# Patient Record
Sex: Female | Born: 1970 | Race: White | Hispanic: No | Smoking: Current every day smoker
Health system: Southern US, Community
[De-identification: ages and names within clinical notes are randomized; demographics above are authoritative.]

## PROBLEM LIST (undated history)

## (undated) DIAGNOSIS — R569 Unspecified convulsions: Secondary | ICD-10-CM

## (undated) DIAGNOSIS — C801 Malignant (primary) neoplasm, unspecified: Secondary | ICD-10-CM

## (undated) DIAGNOSIS — F319 Bipolar disorder, unspecified: Secondary | ICD-10-CM

## (undated) DIAGNOSIS — M1990 Unspecified osteoarthritis, unspecified site: Secondary | ICD-10-CM

## (undated) DIAGNOSIS — N390 Urinary tract infection, site not specified: Secondary | ICD-10-CM

## (undated) DIAGNOSIS — Q059 Spina bifida, unspecified: Secondary | ICD-10-CM

## (undated) DIAGNOSIS — F209 Schizophrenia, unspecified: Secondary | ICD-10-CM

## (undated) DIAGNOSIS — F29 Unspecified psychosis not due to a substance or known physiological condition: Secondary | ICD-10-CM

## (undated) DIAGNOSIS — G919 Hydrocephalus, unspecified: Secondary | ICD-10-CM

## (undated) DIAGNOSIS — A6000 Herpesviral infection of urogenital system, unspecified: Secondary | ICD-10-CM

## (undated) HISTORY — PX: SPINAL FUSION: SHX223

## (undated) HISTORY — PX: COLON RESECTION: SHX5231

## (undated) HISTORY — DX: Bipolar disorder, unspecified: F31.9

## (undated) HISTORY — PX: HIP SURGERY: SHX245

## (undated) HISTORY — PX: DENTAL SURGERY: SHX609

## (undated) HISTORY — PX: LEFT COLECTOMY: SHX856

## (undated) HISTORY — PX: CLUB FOOT RELEASE: SHX1363

## (undated) HISTORY — DX: Schizophrenia, unspecified: F20.9

---
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
## 2011-12-21 ENCOUNTER — Encounter (HOSPITAL_COMMUNITY): Payer: Self-pay | Admitting: *Deleted

## 2011-12-21 ENCOUNTER — Emergency Department (HOSPITAL_COMMUNITY): Payer: Medicare Other

## 2011-12-21 ENCOUNTER — Emergency Department (HOSPITAL_COMMUNITY)
Admission: EM | Admit: 2011-12-21 | Discharge: 2011-12-22 | Disposition: A | Discharge status: Home / Self Care | Payer: Medicare Other | Attending: Emergency Medicine | Admitting: Emergency Medicine

## 2011-12-21 DIAGNOSIS — R45851 Suicidal ideations: Secondary | ICD-10-CM | POA: Insufficient documentation

## 2011-12-21 DIAGNOSIS — Q059 Spina bifida, unspecified: Secondary | ICD-10-CM | POA: Insufficient documentation

## 2011-12-21 DIAGNOSIS — F329 Major depressive disorder, single episode, unspecified: Secondary | ICD-10-CM

## 2011-12-21 DIAGNOSIS — G40909 Epilepsy, unspecified, not intractable, without status epilepticus: Secondary | ICD-10-CM | POA: Insufficient documentation

## 2011-12-21 DIAGNOSIS — Z85038 Personal history of other malignant neoplasm of large intestine: Secondary | ICD-10-CM | POA: Insufficient documentation

## 2011-12-21 DIAGNOSIS — R079 Chest pain, unspecified: Secondary | ICD-10-CM | POA: Insufficient documentation

## 2011-12-21 DIAGNOSIS — Z79899 Other long term (current) drug therapy: Secondary | ICD-10-CM | POA: Insufficient documentation

## 2011-12-21 HISTORY — DX: Hydrocephalus, unspecified: G91.9

## 2011-12-21 HISTORY — DX: Malignant (primary) neoplasm, unspecified: C80.1

## 2011-12-21 HISTORY — DX: Spina bifida, unspecified: Q05.9

## 2011-12-21 HISTORY — DX: Herpesviral infection of urogenital system, unspecified: A60.00

## 2011-12-21 HISTORY — DX: Unspecified convulsions: R56.9

## 2011-12-21 HISTORY — DX: Unspecified osteoarthritis, unspecified site: M19.90

## 2011-12-21 HISTORY — DX: Urinary tract infection, site not specified: N39.0

## 2011-12-21 HISTORY — DX: Unspecified psychosis not due to a substance or known physiological condition: F29

## 2011-12-21 LAB — BASIC METABOLIC PANEL
CO2: 26 mEq/L (ref 19–32)
GFR calc non Af Amer: >90 mL/min (ref >90)
Glucose, Bld: 78 mg/dL (ref 70–99)
Potassium: 3.9 mEq/L (ref 3.5–5.1)
Sodium: 137 mEq/L (ref 135–145)

## 2011-12-21 LAB — URINALYSIS, ROUTINE W REFLEX MICROSCOPIC
Bilirubin Urine: NEGATIVE
Ketones, ur: NEGATIVE mg/dL
Leukocytes, UA: NEGATIVE
Nitrite: NEGATIVE
Specific Gravity, Urine: 1.005 (ref 1.005–1.030)
Urobilinogen, UA: 0.2 mg/dL (ref 0.0–1.0)
pH: 7.5 (ref 5.0–8.0)

## 2011-12-21 LAB — CBC
Hemoglobin: 11.6 g/dL — ABNORMAL LOW (ref 12.0–15.0)
RBC: 4.04 MIL/uL (ref 3.87–5.11)
WBC: 7.2 10*3/uL (ref 4.0–10.5)

## 2011-12-21 LAB — RAPID URINE DRUG SCREEN, HOSP PERFORMED
Amphetamines: NOT DETECTED
Barbiturates: POSITIVE — AB
Cocaine: NOT DETECTED
Opiates: NOT DETECTED
Tetrahydrocannabinol: NOT DETECTED

## 2011-12-21 LAB — PREGNANCY, URINE: Preg Test, Ur: NEGATIVE

## 2011-12-21 MED ORDER — CITALOPRAM HYDROBROMIDE 20 MG PO TABS
40.0000 mg | ORAL_TABLET | Freq: Every day | ORAL | Status: DC
  Administered 2011-12-21 – 2011-12-22 (×2): 40 mg via ORAL
  Filled 2011-12-21 (×2): qty 1
  Filled 2011-12-21: qty 2

## 2011-12-21 MED ORDER — LURASIDONE HCL 40 MG PO TABS
120.0000 mg | ORAL_TABLET | Freq: Every day | ORAL | Status: DC
  Administered 2011-12-21: 120 mg via ORAL
  Filled 2011-12-21: qty 3
  Filled 2011-12-21: qty 1
  Filled 2011-12-21: qty 3

## 2011-12-21 MED ORDER — CHLORPROMAZINE HCL 10 MG PO TABS
10.0000 mg | ORAL_TABLET | Freq: Three times a day (TID) | ORAL | Status: DC
  Administered 2011-12-21 – 2011-12-22 (×5): 10 mg via ORAL
  Filled 2011-12-21 (×7): qty 1

## 2011-12-21 MED ORDER — LORAZEPAM 1 MG PO TABS: 1.0000 mg | ORAL_TABLET | Freq: Three times a day (TID) | ORAL | Status: DC | PRN

## 2011-12-21 MED ORDER — PHENOBARBITAL 32.4 MG PO TABS
64.8000 mg | ORAL_TABLET | Freq: Every day | ORAL | Status: DC
  Administered 2011-12-21: 64.8 mg via ORAL
  Filled 2011-12-21: qty 2

## 2011-12-21 MED ORDER — LORATADINE 10 MG PO TABS
10.0000 mg | ORAL_TABLET | Freq: Every day | ORAL | Status: DC
  Administered 2011-12-21 – 2011-12-22 (×2): 10 mg via ORAL
  Filled 2011-12-21 (×2): qty 1

## 2011-12-21 MED ORDER — ADULT MULTIVITAMIN W/MINERALS CH
1.0000 | ORAL_TABLET | Freq: Every day | ORAL | Status: DC
  Administered 2011-12-22: 1 via ORAL
  Filled 2011-12-21 (×2): qty 1

## 2011-12-21 MED ORDER — DOCUSATE SODIUM 100 MG PO CAPS
100.0000 mg | ORAL_CAPSULE | Freq: Every day | ORAL | Status: DC
  Filled 2011-12-21 (×2): qty 1

## 2011-12-21 MED ORDER — PANTOPRAZOLE SODIUM 40 MG PO TBEC
40.0000 mg | DELAYED_RELEASE_TABLET | Freq: Every day | ORAL | Status: DC
  Administered 2011-12-21 – 2011-12-22 (×2): 40 mg via ORAL
  Filled 2011-12-21 (×2): qty 1

## 2011-12-21 MED ORDER — ONDANSETRON HCL 4 MG PO TABS: 4.0000 mg | ORAL_TABLET | Freq: Three times a day (TID) | ORAL | Status: DC | PRN

## 2011-12-21 MED ORDER — ACETAMINOPHEN 325 MG PO TABS: 650.0000 mg | ORAL_TABLET | ORAL | Status: DC | PRN

## 2011-12-21 MED ORDER — PANTOPRAZOLE SODIUM 20 MG PO TBEC: 20.0000 mg | DELAYED_RELEASE_TABLET | Freq: Every day | ORAL | Status: DC

## 2011-12-21 MED ORDER — FAMOTIDINE 20 MG PO TABS
20.0000 mg | ORAL_TABLET | Freq: Every day | ORAL | Status: DC
  Administered 2011-12-21 – 2011-12-22 (×2): 20 mg via ORAL
  Filled 2011-12-21 (×2): qty 1

## 2011-12-21 NOTE — BHH Counselor (Signed)
Spoke with Lauralee [facility] who authorized 7 days; #[id number] 

## 2011-12-21 NOTE — ED Notes (Signed)
Telepsych called and papers faxed.

## 2011-12-21 NOTE — ED Notes (Signed)
Patient incontinent of urine. Patient transported to shower and bed bath given. Full linen changed and clean paper scrubs given. Patient returned back to Hallway [address]. Sitter at bedside.

## 2011-12-21 NOTE — ED Notes (Signed)
Patient refusing 2nd troponin blood draw. Dr Manus Gunning made aware.

## 2011-12-21 NOTE — ED Provider Notes (Signed)
History     CSN: [id number]  Arrival date & time 12/21/11  9562   First MD Initiated Contact with Patient 12/21/11 0539      Chief Complaint  Patient presents with  . Chest Pain  . Medical Clearance    (Consider location/radiation/quality/duration/timing/severity/associated sxs/prior treatment) HPI  Lori [name] is a 41 y.o. female with a h/o spina bifida, seizure, colon cancer, hydrocephalus and psychosis brought in by ambulance accompanied by law enforcement with IVC paperwork, with suicidal ideation and chest pain. She is a resident of [facility] in [place]. She is under the guardianship of [name] [phone]. Patient states she is going to kill herself by taking poison if she has to continue living in the facility. She has no access to poison. She states she has been hospitalized in the past for taking poison (drank liquid soap). When law enforcement went to pick her up she stated she had chest pain which allowed for calling EMS for ambulance transport to the hospital. Upon arrival chest pain had resolved.   PCP Dr. Molli Barrows, [facility] Past Medical History  Diagnosis Date  . Spinal bifida, closed   . UTI (lower urinary tract infection)   . Seizures   . Cancer     colon  . Genital herpes   . Arthritis   . Hydrocephalus   . Psychoses     Past Surgical History  Procedure Date  . Club foot release   . Spinal fusion   . Hip surgery   . Dental surgery   . Abdominal surgery     No family history on file.  History  Substance Use Topics  . Smoking status: Current Some Day Smoker  . Smokeless tobacco: Not on file  . Alcohol Use: No    OB History    Grav Para Term Preterm Abortions TAB SAB Ect Mult Living                  Review of Systems  Constitutional: Negative for fever.       10 Systems reviewed and are negative for acute change except as noted in the HPI.  HENT: Negative for congestion.   Eyes: Negative for discharge and redness.   Respiratory: Negative for cough and shortness of breath.   Cardiovascular: Negative for chest pain.  Gastrointestinal: Negative for vomiting and abdominal pain.  Musculoskeletal: Negative for back pain.  Skin: Negative for rash.  Neurological: Negative for syncope, numbness and headaches.  Psychiatric/Behavioral:       Suicidal ideation    Allergies  Advil; Aleve; Asa; Bee venom; Crab; and Morphine and related  Home Medications   Current Outpatient Rx  Name Route Sig Dispense Refill  . ALPRAZOLAM 0.25 MG PO TABS Oral Take 0.25 mg by mouth 2 (two) times daily.    . CHLORPROMAZINE HCL 10 MG PO TABS Oral Take by mouth.    Marland Kitchen CITALOPRAM HYDROBROMIDE 40 MG PO TABS Oral Take 40 mg by mouth daily.     Marland Kitchen DOCUSATE SODIUM 100 MG PO CAPS Oral Take 100 mg by mouth at bedtime.    Marland Kitchen FAMOTIDINE 20 MG PO TABS Oral Take 20 mg by mouth daily.     Marland Kitchen LORATADINE 10 MG PO TABS Oral Take by mouth.    . LURASIDONE HCL 40 MG PO TABS Oral Take 120 mg by mouth at bedtime.    . CERTAVITE/ANTIOXIDANTS PO TABS Oral Take 1 tablet by mouth daily.    . OXYCODONE-ACETAMINOPHEN 5-300  MG PO TABS Oral Take 1 tablet by mouth.    . OXYCODONE-ACETAMINOPHEN 5-325 MG PO TABS Oral Take 1 tablet by mouth every 6 (six) hours as needed.    Marland Kitchen PANTOPRAZOLE SODIUM 20 MG PO TBEC Oral Take 20 mg by mouth daily.     Marland Kitchen PHENOBARBITAL 100 MG PO TABS Oral Take 64.8 mg by mouth at bedtime.       BP 119/66  Pulse 87  Temp 98.4 F (36.9 C) (Oral)  Resp 16  Ht 4\' 9"  (1.448 m)  Wt 116 lb (52.617 kg)  BMI 25.10 kg/m2  SpO2 100%  Physical Exam  Nursing note and vitals reviewed. Constitutional: She is oriented to person, place, and time. She appears well-developed and well-nourished.       Awake, alert, nontoxic appearance.  HENT:  Head: Atraumatic.  Eyes: EOM are normal. Pupils are equal, round, and reactive to light. Right eye exhibits no discharge. Left eye exhibits no discharge.  Neck: Neck supple.  Cardiovascular: Normal  heart sounds.   Pulmonary/Chest: Effort normal and breath sounds normal. She exhibits no tenderness.  Abdominal: Soft. Bowel sounds are normal. There is no tenderness. There is no rebound.  Musculoskeletal: She exhibits no tenderness.       Baseline ROM, no obvious new focal weakness.Patient with atrophied legs from spina bifida. Walks using arm brace walker.  Neurological: She is alert and oriented to person, place, and time.       Mental status and motor strength appears baseline for patient and situation.  Skin: No rash noted.  Psychiatric: She has a normal mood and affect.       While she professes to be suicidal it is in the cotext of getting out of the current living situation. She has no real plan of obtaining poison. She states she could try and drink soap again. She is not homicidal. She denies AVH.    ED Course  Procedures (including critical care time) Results for orders placed during the hospital encounter of 12/21/11  CBC      Component Value Range   WBC 7.2  4.0 - 10.5 K/uL   RBC 4.04  3.87 - 5.11 MIL/uL   Hemoglobin 11.6 (*) 12.0 - 15.0 g/dL   HCT 16.1  09.6 - 04.5 %   MCV 89.9  78.0 - 100.0 fL   MCH 28.7  26.0 - 34.0 pg   MCHC 32.0  30.0 - 36.0 g/dL   RDW 40.9  81.1 - 91.4 %   Platelets 254  150 - 400 K/uL  BASIC METABOLIC PANEL      Component Value Range   Sodium 137  135 - 145 mEq/L   Potassium 3.9  3.5 - 5.1 mEq/L   Chloride 102  96 - 112 mEq/L   CO2 26  19 - 32 mEq/L   Glucose, Bld 78  70 - 99 mg/dL   BUN 7  6 - 23 mg/dL   Creatinine, Ser 7.82 (*) 0.50 - 1.10 mg/dL   Calcium 9.1  8.4 - 95.6 mg/dL   GFR calc non Af Amer >90  >90 mL/min   GFR calc Af Amer >90  >90 mL/min  ETHANOL      Component Value Range   Alcohol, Ethyl (B) <11  0 - 11 mg/dL  URINE RAPID DRUG SCREEN (HOSP PERFORMED)      Component Value Range   Opiates NONE DETECTED  NONE DETECTED   Cocaine NONE DETECTED  NONE DETECTED   Benzodiazepines  NONE DETECTED  NONE DETECTED   Amphetamines  NONE DETECTED  NONE DETECTED   Tetrahydrocannabinol NONE DETECTED  NONE DETECTED   Barbiturates POSITIVE (*) NONE DETECTED  PREGNANCY, URINE      Component Value Range   Preg Test, Ur NEGATIVE  NEGATIVE  URINALYSIS, ROUTINE W REFLEX MICROSCOPIC      Component Value Range   Color, Urine YELLOW  YELLOW   APPearance CLEAR  CLEAR   Specific Gravity, Urine 1.005  1.005 - 1.030   pH 7.5  5.0 - 8.0   Glucose, UA NEGATIVE  NEGATIVE mg/dL   Hgb urine dipstick NEGATIVE  NEGATIVE   Bilirubin Urine NEGATIVE  NEGATIVE   Ketones, ur NEGATIVE  NEGATIVE mg/dL   Protein, ur NEGATIVE  NEGATIVE mg/dL   Urobilinogen, UA 0.2  0.0 - 1.0 mg/dL   Nitrite NEGATIVE  NEGATIVE   Leukocytes, UA NEGATIVE  NEGATIVE  TROPONIN I      Component Value Range   Troponin I <0.30  <0.30 ng/mL    Date: 12/21/2011  1610  Rate: 96  Rhythm: normal sinus rhythm and with a short PR  QRS Axis: normal  Intervals: QT prolonged  ST/T Wave abnormalities: nonspecific ST/T changes  Conduction Disutrbances:none  Narrative Interpretation:   Old EKG Reviewed: none available  No results found.   No diagnosis found.    MDM  Patient who is a rest home resident under guardianship with suicidal ideation. ALso with c/o chest pain. Troponin is negative. EKG is unremarkable. Medically cleared for psychiatric evaluation.  MDM Reviewed: nursing note and vitals Interpretation: labs and ECG           Nicoletta Dress. Colon Branch, MD 12/21/11 [phone]

## 2011-12-21 NOTE — BHH Counselor (Signed)
Pt declined to [facility] by Dr. Allena Katz due to, "pt needs to go back to [facility]."
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
## 2011-12-21 NOTE — BHH Counselor (Signed)
Spoke with Thayer Ohm at [facility] who stated no female beds at this time.

## 2011-12-21 NOTE — ED Provider Notes (Signed)
Psychiatrist recommends admission and inpatient stabilization.  ACT aware.  Glynn Octave, MD 12/21/11 [phone]

## 2011-12-21 NOTE — ED Notes (Signed)
Pt in under IVC paper from the group home. Subject was riding wheelchair up [address] & has made several statemewnt about suicide.

## 2011-12-21 NOTE — ED Notes (Signed)
Patient lying in bed sleeping at this time. Rise and fall of chest noted. Sitter at bedside. 

## 2011-12-21 NOTE — BHH Counselor (Signed)
Sp[facility] with Ed[facility] who authorized 7 days; [phone]
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
## 2011-12-22 MED ORDER — LURASIDONE HCL 120 MG PO TABS
120.0000 mg | ORAL_TABLET | Freq: Every day | ORAL | Status: DC
  Filled 2011-12-22: qty 1

## 2011-12-22 NOTE — ED Notes (Signed)
Woke patient and advised that I needed to clean her up and change her. Patient refused to let me check her diaper and stated "I'm not wet." Advised patient that I would return in 1 hour before breakfast arrived to change her and clean her up. Patient verbalized understanding. Patient did allow me to obtain vital signs before falling back to sleep.

## 2011-12-22 NOTE — ED Provider Notes (Signed)
Patient has been cooperative today no specific problems. Discussed plan with ACT team behavioral health member, patient is going to be a difficult placement, her placement in a assisted living was inappropriate patient requires a full type placement, that is going to be difficult to obtain. Expectation is patient will be with [place] for a long period of time. The behavioral health team will work on this daily.  Shelda Jakes, MD 12/22/11 [phone]

## 2011-12-22 NOTE — BH Assessment (Signed)
[facility] Assessment Progress Note      12/22/2011 6:45 PM Patient is being released from the IVC paperwork at [facility] Team member is walking into the Emergency room. Staff requested ACT member to release IVC paperwork per Dr. Estell Harpin. Spoke briefly with Dr. Estell Harpin to confirm the release of the paperwork; he stated to discharge the patient to the guardian. Paperwork completed and as reported by staff, patient is returning to rest home. No further disposition requested by guardian or MD.  Shon Baton, MSW, LCSW, LCASA, CSW-G

## 2011-12-22 NOTE — BHH Counselor (Signed)
Called [facility] spoke with costumer service representative, Loura Back who attempted to connect me with patient's care coordinator Kandis Mannan [phone]) or Ms. Dorena Bodo supervisor Stanford Breed [phone]) both were currently in a meeting. She stated she would call me back with more information. Received a call back indicating that she had spoken with her supervisor Milford Cage) director of costumer services who stated that they were going to get in touch with one of the persons above to help coordinate help with finding a crisis bed to get patient out of our emergency department.
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
## 2011-12-22 NOTE — ED Provider Notes (Signed)
Pt no longer suicidal and wants to go back to the group home. Her guardian is here to take her  Lori Lennert, MD 12/22/11 [phone]

## 2011-12-22 NOTE — Progress Notes (Signed)
1610 Sleeping. No behavioral issues tonight. Tele psych recommended admission. Awaiting [facility].

## 2012-01-17 ENCOUNTER — Emergency Department (HOSPITAL_COMMUNITY)
Admission: EM | Admit: 2012-01-17 | Discharge: 2012-01-23 | Disposition: A | Discharge status: Home / Self Care | Payer: Medicare Other | Attending: Emergency Medicine | Admitting: Emergency Medicine

## 2012-01-17 ENCOUNTER — Encounter (HOSPITAL_COMMUNITY): Payer: Self-pay

## 2012-01-17 DIAGNOSIS — R45851 Suicidal ideations: Secondary | ICD-10-CM | POA: Insufficient documentation

## 2012-01-17 DIAGNOSIS — M129 Arthropathy, unspecified: Secondary | ICD-10-CM | POA: Insufficient documentation

## 2012-01-17 DIAGNOSIS — F17200 Nicotine dependence, unspecified, uncomplicated: Secondary | ICD-10-CM | POA: Insufficient documentation

## 2012-01-17 DIAGNOSIS — M25569 Pain in unspecified knee: Secondary | ICD-10-CM | POA: Insufficient documentation

## 2012-01-17 DIAGNOSIS — R4689 Other symptoms and signs involving appearance and behavior: Secondary | ICD-10-CM

## 2012-01-17 DIAGNOSIS — F603 Borderline personality disorder: Secondary | ICD-10-CM | POA: Insufficient documentation

## 2012-01-17 DIAGNOSIS — T50901A Poisoning by unspecified drugs, medicaments and biological substances, accidental (unintentional), initial encounter: Secondary | ICD-10-CM

## 2012-01-17 DIAGNOSIS — G839 Paralytic syndrome, unspecified: Secondary | ICD-10-CM | POA: Insufficient documentation

## 2012-01-17 DIAGNOSIS — G40909 Epilepsy, unspecified, not intractable, without status epilepticus: Secondary | ICD-10-CM | POA: Insufficient documentation

## 2012-01-17 DIAGNOSIS — Z85038 Personal history of other malignant neoplasm of large intestine: Secondary | ICD-10-CM | POA: Insufficient documentation

## 2012-01-17 DIAGNOSIS — Q6689 Other  specified congenital deformities of feet: Secondary | ICD-10-CM | POA: Insufficient documentation

## 2012-01-17 DIAGNOSIS — Q059 Spina bifida, unspecified: Secondary | ICD-10-CM

## 2012-01-17 DIAGNOSIS — Z79899 Other long term (current) drug therapy: Secondary | ICD-10-CM | POA: Insufficient documentation

## 2012-01-17 LAB — BASIC METABOLIC PANEL
BUN: 5 mg/dL — ABNORMAL LOW (ref 6–23)
Creatinine, Ser: 0.42 mg/dL — ABNORMAL LOW (ref 0.50–1.10)
GFR calc Af Amer: >90 mL/min (ref >90)
GFR calc non Af Amer: >90 mL/min (ref >90)

## 2012-01-17 LAB — CBC WITH DIFFERENTIAL/PLATELET
Basophils Absolute: 0.1 10*3/uL (ref 0.0–0.1)
Basophils Relative: 1 % (ref 0–1)
Eosinophils Absolute: 0 10*3/uL (ref 0.0–0.7)
HCT: 42 % (ref 36.0–46.0)
Hemoglobin: 13.4 g/dL (ref 12.0–15.0)
MCH: 28.6 pg (ref 26.0–34.0)
MCHC: 31.9 g/dL (ref 30.0–36.0)
Monocytes Absolute: 0.5 10*3/uL (ref 0.1–1.0)
Monocytes Relative: 8 % (ref 3–12)
Neutrophils Relative %: 65 % (ref 43–77)
RDW: 15 % (ref 11.5–15.5)

## 2012-01-17 LAB — URINALYSIS, ROUTINE W REFLEX MICROSCOPIC
Bilirubin Urine: NEGATIVE
Ketones, ur: 15 mg/dL — AB
Nitrite: NEGATIVE
Protein, ur: NEGATIVE mg/dL
Urobilinogen, UA: 0.2 mg/dL (ref 0.0–1.0)

## 2012-01-17 LAB — ACETAMINOPHEN LEVEL: Acetaminophen (Tylenol), Serum: <15 ug/mL (ref 10–30)

## 2012-01-17 LAB — ETHANOL: Alcohol, Ethyl (B): 72 mg/dL — ABNORMAL HIGH (ref 0–11)

## 2012-01-17 LAB — SALICYLATE LEVEL: Salicylate Lvl: <2 mg/dL — ABNORMAL LOW (ref 2.8–20.0)

## 2012-01-17 LAB — RAPID URINE DRUG SCREEN, HOSP PERFORMED: Barbiturates: POSITIVE — AB

## 2012-01-17 MED ORDER — ALUM & MAG HYDROXIDE-SIMETH 200-200-20 MG/5ML PO SUSP: 30.0000 mL | ORAL | Status: DC | PRN

## 2012-01-17 MED ORDER — ONDANSETRON HCL 4 MG PO TABS
4.0000 mg | ORAL_TABLET | Freq: Three times a day (TID) | ORAL | Status: DC | PRN
  Administered 2012-01-18 – 2012-01-19 (×2): 4 mg via ORAL
  Filled 2012-01-17: qty 1

## 2012-01-17 MED ORDER — CHLORPROMAZINE HCL 10 MG PO TABS
10.0000 mg | ORAL_TABLET | ORAL | Status: DC | PRN
  Filled 2012-01-17 (×7): qty 1

## 2012-01-17 MED ORDER — OXYCODONE-ACETAMINOPHEN 5-325 MG PO TABS: 1.0000 | ORAL_TABLET | Freq: Four times a day (QID) | ORAL | Status: DC | PRN

## 2012-01-17 MED ORDER — ACETAMINOPHEN 325 MG PO TABS: 650.0000 mg | ORAL_TABLET | ORAL | Status: DC | PRN

## 2012-01-17 MED ORDER — HYPROMELLOSE (GONIOSCOPIC) 2.5 % OP SOLN
1.0000 [drp] | Freq: Every day | OPHTHALMIC | Status: DC | PRN
  Filled 2012-01-17: qty 15

## 2012-01-17 MED ORDER — LURASIDONE HCL 40 MG PO TABS
120.0000 mg | ORAL_TABLET | Freq: Every day | ORAL | Status: DC
  Administered 2012-01-18 – 2012-01-22 (×5): 120 mg via ORAL
  Filled 2012-01-17 (×2): qty 3
  Filled 2012-01-17 (×2): qty 1
  Filled 2012-01-17 (×2): qty 3
  Filled 2012-01-17 (×4): qty 1

## 2012-01-17 MED ORDER — NICOTINE 21 MG/24HR TD PT24
21.0000 mg | MEDICATED_PATCH | Freq: Every day | TRANSDERMAL | Status: DC
  Filled 2012-01-17 (×2): qty 1

## 2012-01-17 MED ORDER — POLYETHYLENE GLYCOL 3350 17 G PO PACK
17.0000 g | PACK | Freq: Every day | ORAL | Status: DC
  Filled 2012-01-17: qty 1

## 2012-01-17 MED ORDER — PANTOPRAZOLE SODIUM 40 MG PO TBEC
20.0000 mg | DELAYED_RELEASE_TABLET | Freq: Every day | ORAL | Status: DC
  Administered 2012-01-17 – 2012-01-23 (×7): 40 mg via ORAL
  Filled 2012-01-17 (×4): qty 1

## 2012-01-17 MED ORDER — LORAZEPAM 1 MG PO TABS: 1.0000 mg | ORAL_TABLET | Freq: Three times a day (TID) | ORAL | Status: DC | PRN

## 2012-01-17 MED ORDER — POLYVINYL ALCOHOL 1.4 % OP SOLN
1.0000 [drp] | Freq: Every day | OPHTHALMIC | Status: DC | PRN
  Filled 2012-01-17 (×2): qty 15

## 2012-01-17 MED ORDER — LURASIDONE HCL 80 MG PO TABS
120.0000 mg | ORAL_TABLET | Freq: Every day | ORAL | Status: DC
  Filled 2012-01-17 (×2): qty 1

## 2012-01-17 MED ORDER — CITALOPRAM HYDROBROMIDE 20 MG PO TABS
40.0000 mg | ORAL_TABLET | Freq: Every day | ORAL | Status: DC
  Administered 2012-01-18 – 2012-01-23 (×5): 40 mg via ORAL
  Filled 2012-01-17 (×2): qty 2
  Filled 2012-01-17 (×2): qty 1
  Filled 2012-01-17 (×5): qty 2

## 2012-01-17 MED ORDER — PHENOBARBITAL 32.4 MG PO TABS
64.8000 mg | ORAL_TABLET | Freq: Every day | ORAL | Status: DC
  Administered 2012-01-18 – 2012-01-22 (×6): 64.8 mg via ORAL
  Filled 2012-01-17 (×2): qty 2

## 2012-01-17 MED ORDER — LORATADINE 10 MG PO TABS
10.0000 mg | ORAL_TABLET | Freq: Every day | ORAL | Status: DC
  Administered 2012-01-17 – 2012-01-23 (×6): 10 mg via ORAL
  Filled 2012-01-17 (×3): qty 1

## 2012-01-17 MED ORDER — ZOLPIDEM TARTRATE 5 MG PO TABS
10.0000 mg | ORAL_TABLET | Freq: Every evening | ORAL | Status: DC | PRN
  Administered 2012-01-19 – 2012-01-21 (×3): 10 mg via ORAL
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
## 2012-01-17 NOTE — ED Notes (Signed)
Patient belonging put in the EMS Lockers

## 2012-01-17 NOTE — ED Notes (Signed)
Lori [name] from [place] [facility] called to find out if patient was coming back [phone]

## 2012-01-17 NOTE — ED Notes (Signed)
Pt moves to end of bed and sits in floor for unknown reason. Pt assisted back into bed
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
## 2012-01-18 NOTE — ED Notes (Signed)
Patient's armband laying on computer table. No armband located on patient. Registration reprinted armband and placed on patient. Advised patient of importance of wearing armband at all times. Patient verbalized understanding. Repositioned patient, gave warm blanket and extra pillow as requested.
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
## 2012-01-18 NOTE — ED Notes (Signed)
Pt ate all of her lunch tray. Lori [name]

## 2012-01-18 NOTE — BHH Counselor (Signed)
Patient was authorized by [facility] to be referred to [facility]. Referral form and supporting paperwork completed and faxed to [facility].  Waiting for acceptance to wait list.
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
## 2012-01-18 NOTE — Progress Notes (Signed)
8:32 AM Resting comfortably, ate some breakfast.  Awaiting placement.

## 2012-01-19 MED ORDER — ZOLPIDEM TARTRATE 5 MG PO TABS
ORAL_TABLET | ORAL | Status: AC
  Administered 2012-01-19: 10 mg via ORAL
  Filled 2012-01-19: qty 2

## 2012-01-19 MED ORDER — ONDANSETRON HCL 4 MG PO TABS
ORAL_TABLET | ORAL | Status: AC
  Administered 2012-01-19: 4 mg via ORAL
  Filled 2012-01-19: qty 1

## 2012-01-19 MED ORDER — PHENOBARBITAL 32.4 MG PO TABS
ORAL_TABLET | ORAL | Status: AC
  Filled 2012-01-19: qty 2

## 2012-01-19 NOTE — ED Notes (Addendum)
Ella (ACT) here to evaluate pt  

## 2012-01-19 NOTE — ED Notes (Signed)
Faxed paperwork from guardian to [facility] per [employer].

## 2012-01-19 NOTE — ED Provider Notes (Signed)
Pt stable, labs/vitals reviewed Currently awaiting [facility] placement BP 121/70  Pulse 76  Temp 98.4 F (36.9 C) (Oral)  Resp 20  Ht 4\' 9"  (1.448 m)  Wt 116 lb (52.617 kg)  BMI 25.10 kg/m2  SpO2 100%   Joya Gaskins, MD 01/19/12 [phone]

## 2012-01-19 NOTE — Progress Notes (Addendum)
ATTEMPTED TO COMPLETE DIVERSION FORM AND FAX TO [facility] FOR APPROVAL TO BE FAXED TO [facility] BUT WAS UNABLE TO COLLECT ENOUGH CRITERIA FOR A MR DIVERSION.  SPOKE WITH MIKE AT [facility] WHO REPORTED PT IS NOT ON THE DIVERSION LIST AND THERE IS NO RECORD OF HER BEING MR. KATIE HORN OF [facility] CALLED AND REPORTED SHE CONTACTED [facility] TO DISCUSS THIS AND SOMEONE WILL CALL HER BACK AS THEY TOO APPEARED TO BE CONFUSED ABOUT THE MR STATUS.  SPOKE WITH GREG WRIEDA, ON COMING ACT TEAM WORKER AND UPDATED HIM ON THIS SITUATION.  CALLED [facility] [phone] AND SPOKE TO JESS. GAVE SOME CLINICAL FOR REFERRAL, NO BEDS TODAY BUT REFERRAL CAN BE FAXED TOMORROW.
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
## 2012-01-19 NOTE — ED Notes (Signed)
Patient was given her breakfast tray, she stated that she was not going to eat now. Tray was left at bedside.

## 2012-01-19 NOTE — ED Notes (Signed)
Received report on pt, pt states that she is tired, requesting to take a nap, comfort measures provided,

## 2012-01-19 NOTE — ED Notes (Signed)
Patient requesting medication to help her sleep. PRN order for Ambien to be administered to patient.

## 2012-01-19 NOTE — ED Notes (Signed)
Pt lying in bed, watching tv, sitter at bedside,  

## 2012-01-19 NOTE — Progress Notes (Signed)
CALLED [facility] FOR COPY OF GUARDIANSHIP PAPERWORK AND WAS UNABLE TO FIND A COPY.  CALLED PT'S GUARDIAN CHERYL [facility] [phone] WHO AGREED TO FAX A COPY TO THE  ED. A COPY WILL THEN BE FAXED TO [facility].

## 2012-01-19 NOTE — ED Notes (Signed)
C/o nausea, no vomiting noted, zofran given per prn order.

## 2012-01-19 NOTE — ED Notes (Signed)
Patient is not alerting staff to when she is incontinent of Bowel and Bladder and in need of assistance. Reminded patient that she needs to let staff know when she needs care to keep her skin free of breakdowns.

## 2012-01-19 NOTE — ED Notes (Signed)
Pt incontinent of urine, pt cleaned, new linen placed. Pt updated on plan of care, pt still believes that she is going to be transported to [facility], states that she wants to go there and live.

## 2012-01-20 MED ORDER — PHENOBARBITAL 32.4 MG PO TABS
ORAL_TABLET | ORAL | Status: AC
  Administered 2012-01-20: 64.8 mg via ORAL
  Filled 2012-01-20: qty 2

## 2012-01-20 MED ORDER — LORATADINE 10 MG PO TABS
ORAL_TABLET | ORAL | Status: AC
  Administered 2012-01-20: 10 mg via ORAL
  Filled 2012-01-20: qty 1

## 2012-01-20 MED ORDER — PANTOPRAZOLE SODIUM 40 MG PO TBEC
DELAYED_RELEASE_TABLET | ORAL | Status: AC
  Administered 2012-01-20: 40 mg via ORAL
  Filled 2012-01-20: qty 1

## 2012-01-20 MED ORDER — LORAZEPAM 1 MG PO TABS
ORAL_TABLET | ORAL | Status: AC
  Filled 2012-01-20: qty 1

## 2012-01-20 MED ORDER — OXYCODONE-ACETAMINOPHEN 5-325 MG PO TABS
ORAL_TABLET | ORAL | Status: AC
  Administered 2012-01-20: 08:00:00
  Filled 2012-01-20: qty 1

## 2012-01-20 NOTE — ED Notes (Signed)
Breakfast tray given. °

## 2012-01-20 NOTE — ED Notes (Signed)
Pt refused to eat breakfast stated that is was cold when offered to heat it up, she stated "all I need is my coffee".

## 2012-01-20 NOTE — ED Notes (Signed)
Pt sleeping soundly.

## 2012-01-20 NOTE — ED Notes (Signed)
Patient states she is wet and needs cleaning. NT cleaned patient.

## 2012-01-20 NOTE — ED Notes (Signed)
Per Amy, NT sitting with patient. Patient had bowel movement. NT cleaned patient and applied skin barrier to patient's bottom. Also repositioned patient.

## 2012-01-20 NOTE — ED Notes (Addendum)
Lab tech came from room and stated that patient would not allow her to draw her blood that is ordered. Explained to patient the reason and importance of drawing the blood work. Patient stated she would allow lab tech to draw blood. Patient tolerated well. Covered patient back up and cut off lights. Patient states she is going back to sleep. Sitter at bedside.

## 2012-01-20 NOTE — ED Notes (Signed)
Pt refused mirlax and nicotine patch. Cooperative at this time
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
## 2012-01-20 NOTE — ED Provider Notes (Signed)
Pt resting in room, NAD.  No issues reported during the overnight shift.  Vital signs remain stable and consistent.  Pt is awaiting placement.  Will continue to follow closely.  Tobin Chad, MD 01/20/12 [phone]

## 2012-01-21 LAB — PHENOBARBITAL LEVEL: Phenobarbital: 13.3 ug/mL — ABNORMAL LOW (ref 15.0–40.0)

## 2012-01-21 MED ORDER — ZOLPIDEM TARTRATE 5 MG PO TABS
ORAL_TABLET | ORAL | Status: AC
  Administered 2012-01-21: 10 mg via ORAL
  Filled 2012-01-21: qty 2

## 2012-01-21 MED ORDER — PHENOBARBITAL 32.4 MG PO TABS
ORAL_TABLET | ORAL | Status: AC
  Administered 2012-01-21: 64.8 mg via ORAL
  Filled 2012-01-21: qty 2

## 2012-01-21 MED ORDER — LORATADINE 10 MG PO TABS
ORAL_TABLET | ORAL | Status: AC
  Administered 2012-01-21: 10 mg via ORAL
  Filled 2012-01-21: qty 1

## 2012-01-21 MED ORDER — ZOLPIDEM TARTRATE 5 MG PO TABS
ORAL_TABLET | ORAL | Status: AC
  Filled 2012-01-21: qty 2

## 2012-01-21 MED ORDER — POLYETHYLENE GLYCOL 3350 17 G PO PACK
PACK | ORAL | Status: AC
  Filled 2012-01-21: qty 1

## 2012-01-21 MED ORDER — NICOTINE 21 MG/24HR TD PT24
MEDICATED_PATCH | TRANSDERMAL | Status: AC
  Filled 2012-01-21: qty 1

## 2012-01-21 MED ORDER — PANTOPRAZOLE SODIUM 40 MG PO TBEC
DELAYED_RELEASE_TABLET | ORAL | Status: AC
  Administered 2012-01-21: 40 mg via ORAL
  Filled 2012-01-21: qty 1

## 2012-01-21 NOTE — ED Notes (Signed)
Pt is sleeping. Vitals to be obtained when pt wakes up. Sitter remains at bedside.

## 2012-01-21 NOTE — ED Provider Notes (Signed)
Filed Vitals:   01/21/12 1038  BP: 110/66  Pulse: 92  Temp:   Resp: 16   Pt with NAD issues through out day. Still waiting placement which looks like won't happen until tomorrow at earliest.  Raeford Razor, MD 01/21/12 1626

## 2012-01-21 NOTE — ED Notes (Signed)
RN, Marjean Donna from [facility] called and stated that she did receive the referral but they are currently full. Samson Frederic from Act team made aware. Samson Frederic spoke with staff yesterday and staff from [facility] said they would look into in on Monday.
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
## 2012-01-21 NOTE — ED Notes (Signed)
Lori [name] from Act team told staff that referrals have been sent to [facility] and [facility] in regards to pt tranfers. [facility] is full but plan to evaluate situation tomorrow. Awaiting acceptance at both. RN made aware.

## 2012-01-21 NOTE — BHH Counselor (Signed)
Pt. declined by [facility], due to patient acuity and unit Acuity as well.  Pt was declined by Letitia Libra on 01/18/12, due to patient acuity and by Dr, Readling on 12/21/11, stating that [facility]

## 2012-01-22 LAB — PHENOBARBITAL LEVEL: Phenobarbital: 13.8 ug/mL — ABNORMAL LOW (ref 15.0–40.0)

## 2012-01-22 MED ORDER — PANTOPRAZOLE SODIUM 40 MG PO TBEC
DELAYED_RELEASE_TABLET | ORAL | Status: AC
  Filled 2012-01-22: qty 1

## 2012-01-22 MED ORDER — PHENOBARBITAL 32.4 MG PO TABS
ORAL_TABLET | ORAL | Status: AC
  Filled 2012-01-22: qty 2

## 2012-01-22 MED ORDER — LORATADINE 10 MG PO TABS
ORAL_TABLET | ORAL | Status: AC
  Filled 2012-01-22: qty 1

## 2012-01-22 NOTE — ED Provider Notes (Signed)
Patient complains of right knee pain. Exam is unremarkable. There is no evidence of effusion or instability. Her vital signs remained normal. She is still awaiting placement. I discussed with her the advisability of getting a psychiatry consultation but she states she absolutely refuses to talk to a psychiatrist, especially when she has to talk via a video conference.  Dione Booze, MD 01/22/12 1121

## 2012-01-22 NOTE — ED Notes (Signed)
Pt requesting to be repositioned, pt assisted to chair, states that she is feeling better sitting up in chair,

## 2012-01-22 NOTE — ED Notes (Signed)
Pt updated on plan of care, given coffee per request, sitter at bedside,

## 2012-01-22 NOTE — ED Notes (Signed)
Received report on pt, pt sitting up in bed, given breakfast tray, refusing to eat breakfast, advised pt that she would need to get out of bed today, ? Maybe sit in chair, pt refusing to get out of bed, states "that ain't going to happen today" explained to pt the risks associated with staying in bed, pt still refusing to get out of bed, Dr. Preston Fleeting.
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
## 2012-01-22 NOTE — ED Notes (Signed)
Pt refusing all am medications, offered made to pt on several different attempts. Pt still refusing each offer.

## 2012-01-22 NOTE — ED Notes (Signed)
Pt given meal tray, sitter at bedside,

## 2012-01-22 NOTE — ED Notes (Signed)
Pt assisted to restroom, tolerated well, returned to room, sitting in chair, sitter at bedside,

## 2012-01-22 NOTE — BHH Counselor (Signed)
Called [facility] and spoke with Marylu Lund. She informed us that they were still at capacity, and would not run the patient until their physician had made rounds and they knew what beds were to be open. She told us she would contact us later today. Spoke with Vernona Rieger at [facility], no beds today. We can call 01/23/12 to check as they do not take referals unless they have beds.

## 2012-01-23 MED ORDER — PANTOPRAZOLE SODIUM 40 MG PO TBEC
DELAYED_RELEASE_TABLET | ORAL | Status: AC
  Administered 2012-01-23: 40 mg via ORAL
  Filled 2012-01-23: qty 1

## 2012-01-23 MED ORDER — LORATADINE 10 MG PO TABS
ORAL_TABLET | ORAL | Status: AC
  Administered 2012-01-23: 10 mg via ORAL
  Filled 2012-01-23: qty 1

## 2012-01-23 MED ORDER — ONDANSETRON HCL 4 MG PO TABS
ORAL_TABLET | ORAL | Status: AC
  Filled 2012-01-23: qty 1

## 2012-01-23 MED ORDER — OXYCODONE-ACETAMINOPHEN 5-325 MG PO TABS
ORAL_TABLET | ORAL | Status: AC
  Filled 2012-01-23: qty 1

## 2012-01-23 NOTE — ED Notes (Signed)
Pt is awake, cooperative, breakfast tray warmed per request. Sitter remains at bedside,

## 2012-01-23 NOTE — ED Notes (Signed)
Report given to Caryl Ada, supervisor in charge of [facility].

## 2012-01-23 NOTE — BHH Counselor (Signed)
Received a call from [name] guardian of the patient. She has contacted [facility] and spoke with with Ms. Ronal Fear, Production designer, theatre/television/film. It has been decided that the patient no longer needs in[patient care and that she in manipulating the system. The staff of the home will come and pick up the patient, at the guardians request and return her to their facility. Discuss this with the patient's nurse and with Dr Estell Harpin. Her nurse has spoken with the patient and she is not expressing suicidal thoughts at this time. She is not homicidal. She continues to refuse to cooperate with treatment recommendation.Dr Estell Harpin agrees with this plan and will discharge the patient. Discussed also with Regan Lemming Director and showed her the [facility] paperwork. She agrees that the discharge can be made.  Ms Ronal Fear is sending staff to the ED to pick up the patient.

## 2012-01-23 NOTE — ED Notes (Addendum)
Pt has finished eating lunch, Caryl Ada from [facility] here to transport pt to group home, pt upset over being sent back to group home, states "I don't like it there, I hate it." comfort measures provided, pt assisted to truck for transport,

## 2012-01-23 NOTE — ED Notes (Addendum)
Pt still admits to HI, states "I have thoughts of hurting some of the nurses in the er because they would not let me have my coffee or food a certain way", pt denies any SI, asked pt what her plans were regarding an upcoming court date on October 3, pt states "the police will come and pick me up and take me to court and then bring me back to where I am at", advised pt that the police did not perform that service, pt then states "my guardian will come and pick me up and take me to the court and bring me back home" advised pt that the guardian would not be able to pick her up, that pt is IVC and not allowed to leave the hospital . Asked pt if she felt that she would be able to go back to group home, pt refusing to go back to group home, states "i feel like I still need to be placed in a hospital somewhere because the staff at the group home are mean to me.

## 2012-01-23 NOTE — ED Notes (Signed)
Went into patient's room to administer zofran and percocet due to patient complaining of pain and notifying sitter. Patient had fallen back asleep. Medication returned to pyxis, will continue to reassess patient.
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
## 2012-01-23 NOTE — ED Notes (Signed)
Phlebotomist went into patient's room to obtain blood sample to check phenobarbital level. Patient refusing to let phlebotomist get blood sample. Went into patient's room to talk with patient. Before I spoke patient stated, "I am not giving blood and that is that." Explained need for phenobarbital level, patient still refused to give blood sample and reported that she hasn't had a seizure and is fine. Voiced that she was upset to sitter that a second person entered room and attempted to talk her into giving blood. Patient stated, "If someone else tries to come in here about that again, I am going to sue this hospital for harrassment."

## 2012-01-23 NOTE — ED Notes (Addendum)
Received report on pt, pt lying on her side, resp even and non labored, eyes closed, breakfast tray given to pt, sitter remains at bedside,

## 2012-02-28 ENCOUNTER — Emergency Department (HOSPITAL_COMMUNITY): Payer: Medicare Other

## 2012-02-28 ENCOUNTER — Encounter (HOSPITAL_COMMUNITY): Payer: Self-pay | Admitting: Emergency Medicine

## 2012-02-28 ENCOUNTER — Emergency Department (HOSPITAL_COMMUNITY)
Admission: EM | Admit: 2012-02-28 | Discharge: 2012-02-28 | Disposition: A | Discharge status: Home / Self Care | Payer: Medicare Other | Attending: Emergency Medicine | Admitting: Emergency Medicine

## 2012-02-28 DIAGNOSIS — F17200 Nicotine dependence, unspecified, uncomplicated: Secondary | ICD-10-CM | POA: Insufficient documentation

## 2012-02-28 DIAGNOSIS — Z85038 Personal history of other malignant neoplasm of large intestine: Secondary | ICD-10-CM | POA: Insufficient documentation

## 2012-02-28 DIAGNOSIS — Z8669 Personal history of other diseases of the nervous system and sense organs: Secondary | ICD-10-CM | POA: Insufficient documentation

## 2012-02-28 DIAGNOSIS — I252 Old myocardial infarction: Secondary | ICD-10-CM | POA: Insufficient documentation

## 2012-02-28 DIAGNOSIS — R079 Chest pain, unspecified: Secondary | ICD-10-CM

## 2012-02-28 DIAGNOSIS — Q059 Spina bifida, unspecified: Secondary | ICD-10-CM | POA: Insufficient documentation

## 2012-02-28 DIAGNOSIS — R0602 Shortness of breath: Secondary | ICD-10-CM | POA: Insufficient documentation

## 2012-02-28 DIAGNOSIS — Z87440 Personal history of urinary (tract) infections: Secondary | ICD-10-CM | POA: Insufficient documentation

## 2012-02-28 DIAGNOSIS — Z8659 Personal history of other mental and behavioral disorders: Secondary | ICD-10-CM | POA: Insufficient documentation

## 2012-02-28 DIAGNOSIS — Z79899 Other long term (current) drug therapy: Secondary | ICD-10-CM | POA: Insufficient documentation

## 2012-02-28 DIAGNOSIS — Z8739 Personal history of other diseases of the musculoskeletal system and connective tissue: Secondary | ICD-10-CM | POA: Insufficient documentation

## 2012-02-28 DIAGNOSIS — R0789 Other chest pain: Secondary | ICD-10-CM | POA: Insufficient documentation

## 2012-02-28 NOTE — ED Notes (Signed)
Pt states room mate was being picked up for transport to ED for SOB and chest pain. Pt states while they were loading her friend she started having chest pain herself. Pt states sob and sweating also.

## 2012-02-28 NOTE — ED Notes (Signed)
Spoke with [facility] family care, advised she would need a ride home.

## 2012-02-28 NOTE — ED Notes (Signed)
Pt c/o sharp/stabbing chest pain that began earlier this morning. Pt also c/o SOB and states pain has gradually become worse. Pt has 22g IV in left forearm. Pt was given 1 nitro SL tab. Pt states she is unable to take aspirin or morphine.

## 2012-02-28 NOTE — ED Provider Notes (Signed)
History   This chart was scribed for Carleene Cooper III, MD by Gerlean Ren. This patient was seen in room APA06/APA06 and the patient's care was started at 2:45 PM.   CSN: [id number]  Arrival date & time 02/28/12  1259   First MD Initiated Contact with Patient 02/28/12 1418      Chief Complaint  Patient presents with  . Chest Pain    The history is provided by the patient. No language interpreter was used.   Lori [name] is a 41 y.o. female who presents to the Emergency Department complaining of gradually worsening sharp/stabbing chest pain and associated dyspnea.  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  Pt denies urinary symptoms, unusual bowel movements, and decreased appetite.  Pt has h/o paranoid scizophrenia.  Pt is a current someday smoker but denies alcohol use.  Past Medical History  Diagnosis Date  . Spinal bifida, closed   . UTI (lower urinary tract infection)   . Seizures   . Cancer     colon  . Genital herpes   . Arthritis   . Hydrocephalus   . Psychoses     Past Surgical History  Procedure Date  . Club foot release   . Spinal fusion   . Hip surgery   . Dental surgery   . Abdominal surgery     History reviewed. No pertinent family history.  History  Substance Use Topics  . Smoking status: Current Some Day Smoker  . Smokeless tobacco: Not on file  . Alcohol Use: No    No OB history provided.  Review of Systems  Respiratory: Positive for shortness of breath.   Cardiovascular: Positive for chest pain.  Genitourinary: Negative for dysuria and difficulty urinating.  All other systems reviewed and are negative.    Allergies  Advil; Bee venom; Crab; Morphine and related; Aleve; and Asa  Home Medications   Current Outpatient Rx  Name  Route  Sig  Dispense   Refill  . CALAMINE EX LOTN   Topical   Apply 1 application topically 3 (three) times daily as needed. Itching         . CALCIUM 600 + D PO   Oral   Take 1 tablet by mouth 2 (two) times daily.         . CHLORPROMAZINE HCL 10 MG PO TABS   Oral   Take 10 mg by mouth every 2 (two) hours as needed.          Marland Kitchen CITALOPRAM HYDROBROMIDE 40 MG PO TABS   Oral   Take 40 mg by mouth daily.          Marland Kitchen HYPROMELLOSE 2.5 % OP SOLN   Both Eyes   Place 1 drop into both eyes daily as needed. Dry Eyes         . LORATADINE 10 MG PO TABS   Oral   Take 10 mg by mouth daily.         Marland Kitchen LURASIDONE HCL 40 MG PO TABS   Oral   Take 120 mg by mouth at bedtime.         [name] SENIOR/ANTIOXIDANT PO   Oral   Take 1 tablet by mouth daily.         . OXYCODONE-ACETAMINOPHEN 5-325  MG PO TABS   Oral   Take 1 tablet by mouth every 6 (six) hours as needed. Pain.         Marland Kitchen PANTOPRAZOLE SODIUM 20 MG PO TBEC   Oral   Take 20 mg by mouth daily.          Marland Kitchen PHENOBARBITAL 64.8 MG PO TABS   Oral   Take 64.8 mg by mouth at bedtime.         Marland Kitchen POLYETHYLENE GLYCOL 3350 PO PACK   Oral   Take 17 g by mouth daily.         Marland Kitchen PRAVASTATIN SODIUM 40 MG PO TABS   Oral   Take 40 mg by mouth daily.         . SELENIUM SULFIDE 1 % EX LOTN   Topical   Apply 1 application topically daily as needed.           BP 116/69  Pulse 88  Temp 98.6 F (37 C) (Oral)  Resp 18  SpO2 99%  Physical Exam  Nursing note and vitals reviewed. Constitutional: She is oriented to person, place, and time. She appears well-developed and well-nourished.  HENT:  Head: Normocephalic and atraumatic.  Eyes: Conjunctivae normal and EOM are normal. Pupils are equal, round, and reactive to light.  Neck: Normal range of motion. Neck supple.  Cardiovascular: Normal rate, regular rhythm and normal heart sounds.   Pulmonary/Chest: Effort normal and breath sounds normal.  Abdominal: Soft. Bowel sounds are normal.  There is no tenderness.  Musculoskeletal: Normal range of motion.       Braces on bilateral legs for paraplegia due to spinal bifida.  Neurological: She is alert and oriented to person, place, and time.  Skin: Skin is warm and dry.  Psychiatric: She has a normal mood and affect.    ED Course  Procedures (including critical care time) DIAGNOSTIC STUDIES: Oxygen Saturation is 99% on room air, normal by my interpretation.    COORDINATION OF CARE: 2:56 PM- Patient informed of clinical course, understands medical decision-making process, and agrees with plan.  2:58 PM  Date: 02/28/2012  Rate: 81  Rhythm: normal sinus rhythm  QRS Axis: normal  Intervals: QT prolonged  ST/T Wave abnormalities: normal  Conduction Disutrbances:none  Narrative Interpretation: Abnormal EKG  Old EKG Reviewed: unchanged    Results for orders placed during the hospital encounter of 02/28/12  POCT I-STAT TROPONIN I      Component Value Range   Troponin i, poc 0.00  0.00 - 0.08 ng/mL   Comment 3             Dg Chest 2 View  02/28/2012  *RADIOLOGY REPORT*  Clinical Data: Chest pain.  CHEST - 2 VIEW  Comparison: December 21, 2011.  Findings: Cardiomediastinal silhouette appears normal.  No acute pulmonary disease is noted.  Bony thorax is intact.  IMPRESSION: No acute cardiopulmonary abnormality seen.   Original Report Authenticated By: Lupita Raider.,  M.D.     5:36 PM  Troponin I was 0.0.  Chest x-ray was normal.  EKG showed no acute change.  Pt reassured and released.   1. Nonspecific chest pain    I personally performed the services described in this documentation, which was scribed in my presence. The recorded information has been reviewed and considered.  Osvaldo Human, MD         Carleene Cooper III, MD 02/28/12 1739

## 2012-04-09 ENCOUNTER — Encounter (HOSPITAL_COMMUNITY): Payer: Self-pay

## 2012-04-09 ENCOUNTER — Emergency Department (HOSPITAL_COMMUNITY)
Admission: EM | Admit: 2012-04-09 | Discharge: 2012-04-11 | Disposition: A | Discharge status: Home / Self Care | Payer: Medicare Other | Source: Home / Self Care | Attending: Emergency Medicine | Admitting: Emergency Medicine

## 2012-04-09 ENCOUNTER — Emergency Department (HOSPITAL_COMMUNITY): Payer: Medicare Other

## 2012-04-09 DIAGNOSIS — Z8739 Personal history of other diseases of the musculoskeletal system and connective tissue: Secondary | ICD-10-CM | POA: Insufficient documentation

## 2012-04-09 DIAGNOSIS — R443 Hallucinations, unspecified: Secondary | ICD-10-CM

## 2012-04-09 DIAGNOSIS — R45851 Suicidal ideations: Secondary | ICD-10-CM

## 2012-04-09 DIAGNOSIS — F329 Major depressive disorder, single episode, unspecified: Secondary | ICD-10-CM

## 2012-04-09 DIAGNOSIS — Z3202 Encounter for pregnancy test, result negative: Secondary | ICD-10-CM | POA: Insufficient documentation

## 2012-04-09 DIAGNOSIS — N390 Urinary tract infection, site not specified: Secondary | ICD-10-CM | POA: Insufficient documentation

## 2012-04-09 DIAGNOSIS — F3289 Other specified depressive episodes: Secondary | ICD-10-CM | POA: Insufficient documentation

## 2012-04-09 DIAGNOSIS — Z8659 Personal history of other mental and behavioral disorders: Secondary | ICD-10-CM | POA: Insufficient documentation

## 2012-04-09 DIAGNOSIS — Z8669 Personal history of other diseases of the nervous system and sense organs: Secondary | ICD-10-CM | POA: Insufficient documentation

## 2012-04-09 DIAGNOSIS — Z85038 Personal history of other malignant neoplasm of large intestine: Secondary | ICD-10-CM | POA: Insufficient documentation

## 2012-04-09 DIAGNOSIS — F17200 Nicotine dependence, unspecified, uncomplicated: Secondary | ICD-10-CM | POA: Insufficient documentation

## 2012-04-09 DIAGNOSIS — Z8742 Personal history of other diseases of the female genital tract: Secondary | ICD-10-CM | POA: Insufficient documentation

## 2012-04-09 LAB — COMPREHENSIVE METABOLIC PANEL
ALT: 12 U/L (ref 0–35)
AST: 15 U/L (ref 0–37)
Alkaline Phosphatase: 161 U/L — ABNORMAL HIGH (ref 39–117)
CO2: 26 mEq/L (ref 19–32)
Chloride: 101 mEq/L (ref 96–112)
GFR calc non Af Amer: >90 mL/min (ref >90)
Potassium: 3.6 mEq/L (ref 3.5–5.1)
Sodium: 137 mEq/L (ref 135–145)
Total Bilirubin: 0.2 mg/dL — ABNORMAL LOW (ref 0.3–1.2)

## 2012-04-09 LAB — CBC WITH DIFFERENTIAL/PLATELET
Basophils Absolute: 0.1 10*3/uL (ref 0.0–0.1)
Eosinophils Relative: 1 % (ref 0–5)
Lymphocytes Relative: 24 % (ref 12–46)
MCV: 89.8 fL (ref 78.0–100.0)
Monocytes Relative: 9 % (ref 3–12)
Neutro Abs: 5.7 10*3/uL (ref 1.7–7.7)
Neutrophils Relative %: 65 % (ref 43–77)
Platelets: 277 10*3/uL (ref 150–400)
RBC: 4.43 MIL/uL (ref 3.87–5.11)
WBC: 8.8 10*3/uL (ref 4.0–10.5)

## 2012-04-09 LAB — SALICYLATE LEVEL: Salicylate Lvl: <2 mg/dL — ABNORMAL LOW (ref 2.8–20.0)

## 2012-04-09 LAB — URINALYSIS, ROUTINE W REFLEX MICROSCOPIC
Glucose, UA: NEGATIVE mg/dL
Hgb urine dipstick: NEGATIVE
Ketones, ur: NEGATIVE mg/dL
Protein, ur: NEGATIVE mg/dL

## 2012-04-09 LAB — ACETAMINOPHEN LEVEL: Acetaminophen (Tylenol), Serum: <15 ug/mL (ref 10–30)

## 2012-04-09 LAB — POCT PREGNANCY, URINE: Preg Test, Ur: NEGATIVE

## 2012-04-09 LAB — URINE MICROSCOPIC-ADD ON

## 2012-04-09 LAB — RAPID URINE DRUG SCREEN, HOSP PERFORMED: Barbiturates: POSITIVE — AB

## 2012-04-09 MED ORDER — ACETAMINOPHEN 325 MG PO TABS: 650.0000 mg | ORAL_TABLET | ORAL | Status: DC | PRN

## 2012-04-09 MED ORDER — POLYETHYLENE GLYCOL 3350 17 G PO PACK
17.0000 g | PACK | Freq: Every day | ORAL | Status: DC
  Filled 2012-04-09: qty 1

## 2012-04-09 MED ORDER — NICOTINE 14 MG/24HR TD PT24: 14.0000 mg | MEDICATED_PATCH | Freq: Every day | TRANSDERMAL | Status: DC

## 2012-04-09 MED ORDER — HYPROMELLOSE (GONIOSCOPIC) 2.5 % OP SOLN
1.0000 [drp] | Freq: Every day | OPHTHALMIC | Status: DC | PRN
  Filled 2012-04-09: qty 15

## 2012-04-09 MED ORDER — LORAZEPAM 1 MG PO TABS
1.0000 mg | ORAL_TABLET | Freq: Three times a day (TID) | ORAL | Status: DC | PRN
  Administered 2012-04-09: 1 mg via ORAL
  Filled 2012-04-09: qty 1

## 2012-04-09 MED ORDER — SIMVASTATIN 20 MG PO TABS
20.0000 mg | ORAL_TABLET | Freq: Every day | ORAL | Status: DC
  Administered 2012-04-10: 20 mg via ORAL
  Filled 2012-04-09 (×2): qty 1

## 2012-04-09 MED ORDER — ALUM & MAG HYDROXIDE-SIMETH 200-200-20 MG/5ML PO SUSP: 30.0000 mL | ORAL | Status: DC | PRN

## 2012-04-09 MED ORDER — CITALOPRAM HYDROBROMIDE 20 MG PO TABS
40.0000 mg | ORAL_TABLET | Freq: Every day | ORAL | Status: DC
  Administered 2012-04-09 – 2012-04-11 (×3): 40 mg via ORAL
  Filled 2012-04-09 (×3): qty 1
  Filled 2012-04-09: qty 2

## 2012-04-09 MED ORDER — LORATADINE 10 MG PO TABS
10.0000 mg | ORAL_TABLET | Freq: Every day | ORAL | Status: DC
  Administered 2012-04-09 – 2012-04-11 (×3): 10 mg via ORAL
  Filled 2012-04-09 (×3): qty 1

## 2012-04-09 MED ORDER — PANTOPRAZOLE SODIUM 20 MG PO TBEC
20.0000 mg | DELAYED_RELEASE_TABLET | Freq: Every day | ORAL | Status: DC
  Filled 2012-04-09 (×3): qty 1

## 2012-04-09 MED ORDER — PHENOBARBITAL 32.4 MG PO TABS
64.8000 mg | ORAL_TABLET | Freq: Every day | ORAL | Status: DC
  Administered 2012-04-09 – 2012-04-10 (×2): 64.8 mg via ORAL
  Filled 2012-04-09 (×2): qty 2

## 2012-04-09 MED ORDER — ONDANSETRON HCL 4 MG PO TABS: 4.0000 mg | ORAL_TABLET | Freq: Three times a day (TID) | ORAL | Status: DC | PRN

## 2012-04-09 MED ORDER — CITALOPRAM HYDROBROMIDE 20 MG PO TABS
ORAL_TABLET | ORAL | Status: AC
  Filled 2012-04-09: qty 2

## 2012-04-09 MED ORDER — ZOLPIDEM TARTRATE 5 MG PO TABS: 5.0000 mg | ORAL_TABLET | Freq: Every evening | ORAL | Status: DC | PRN

## 2012-04-09 MED ORDER — LURASIDONE HCL 80 MG PO TABS
120.0000 mg | ORAL_TABLET | Freq: Every day | ORAL | Status: DC
  Filled 2012-04-09: qty 1

## 2012-04-09 NOTE — ED Notes (Signed)
Pt remains calm and cooperative, released officer

## 2012-04-09 NOTE — ED Provider Notes (Signed)
History   This chart was scribed for Lori Booze, MD, by Frederik Pear, ER scribe. The patient was seen in room APA17/APA17 and the patient's care was started at 1843.    CSN: [id number]  Arrival date & time 04/09/12  1820   First MD Initiated Contact with Patient 04/09/12 1843      Chief Complaint  Patient presents with  . V70.1    (Consider location/radiation/quality/duration/timing/severity/associated sxs/prior treatment) HPI Comments: Lori [name] is a 41 y.o. female with a h/o of depression and spina bifida who presents to the Emergency Department complaining of gradually worsening, constant suicidal ideation that after she became upset during an evaluation at [facility]. She states that she has a plan to slit her throat and cut her wrists. She reports associated depression that has occurred more frequently since 2007 when her brother was murdered and has been present every day this week. She reports associated anhedonia, difficulty sleeping, and hallucinations that include dragon and family members' voices that deliver both negative and positive messages. She is a current,every day smoker twice a day smoker who does not use ETOH or street drugs. She states that she would prefer treatment at [facility].  No PCP.     Past Medical History  Diagnosis Date  . Spinal bifida, closed   . UTI (lower urinary tract infection)   . Seizures   . Cancer     colon  . Genital herpes   . Arthritis   . Hydrocephalus   . Psychoses     Past Surgical History  Procedure Date  . Club foot release   . Spinal fusion   . Hip surgery   . Dental surgery   . Abdominal surgery     No family history on file.  History  Substance Use Topics  . Smoking status: Current Some Day Smoker  . Smokeless tobacco: Not on file  . Alcohol Use: No    OB History    Grav Para Term Preterm Abortions TAB SAB Ect Mult Living                  Review of Systems  Psychiatric/Behavioral: Positive  for suicidal ideas, hallucinations and sleep disturbance.       Depression and anhedonia  All other systems reviewed and are negative.    Allergies  Advil; Bee venom; Crab; Morphine and related; Aleve; and Asa  Home Medications   Current Outpatient Rx  Name  Route  Sig  Dispense  Refill  . CALAMINE EX LOTN   Topical   Apply 1 application topically 3 (three) times daily as needed. Itching         . CALCIUM 600 + D PO   Oral   Take 1 tablet by mouth 2 (two) times daily.         . CHLORPROMAZINE HCL 10 MG PO TABS   Oral   Take 10 mg by mouth every 2 (two) hours as needed.          Marland Kitchen CITALOPRAM HYDROBROMIDE 40 MG PO TABS   Oral   Take 40 mg by mouth daily.          Marland Kitchen HYPROMELLOSE 2.5 % OP SOLN   Both Eyes   Place 1 drop into both eyes daily as needed. Dry Eyes         . LORATADINE 10 MG PO TABS   Oral   Take 10 mg by mouth daily.         Marland Kitchen  LURASIDONE HCL 40 MG PO TABS   Oral   Take 120 mg by mouth at bedtime.         [name] SENIOR/ANTIOXIDANT PO   Oral   Take 1 tablet by mouth daily.         . OXYCODONE-ACETAMINOPHEN 5-325 MG PO TABS   Oral   Take 1 tablet by mouth every 6 (six) hours as needed. Pain.         Marland Kitchen PANTOPRAZOLE SODIUM 20 MG PO TBEC   Oral   Take 20 mg by mouth daily.          Marland Kitchen PHENOBARBITAL 64.8 MG PO TABS   Oral   Take 64.8 mg by mouth at bedtime.         Marland Kitchen POLYETHYLENE GLYCOL 3350 PO PACK   Oral   Take 17 g by mouth daily.         Marland Kitchen PRAVASTATIN SODIUM 40 MG PO TABS   Oral   Take 40 mg by mouth daily.         . SELENIUM SULFIDE 1 % EX LOTN   Topical   Apply 1 application topically daily as needed.           BP 110/60  Pulse 86  Temp 97.8 F (36.6 C) (Oral)  Resp 20  Ht 4\' 9"  (1.448 m)  Wt 91 lb (41.277 kg)  BMI 19.69 kg/m2  SpO2 100%  Physical Exam  Nursing note and vitals reviewed. Constitutional: She is oriented to person, place, and time. She appears well-developed and well-nourished. No  distress.       She appears depressed.  HENT:  Head: Normocephalic and atraumatic.  Eyes: EOM are normal. Pupils are equal, round, and reactive to light.  Neck: Normal range of motion. Neck supple. No tracheal deviation present.  Cardiovascular: Normal rate.   Pulmonary/Chest: Effort normal. No respiratory distress.  Abdominal: Soft. She exhibits no distension.  Musculoskeletal: Normal range of motion. She exhibits no edema.       She has braces on both lower legs.  Neurological: She is alert and oriented to person, place, and time.       She has weakness of both legs.  Skin: Skin is warm and dry.  Psychiatric: She has a normal mood and affect. Her behavior is normal.    ED Course  Procedures (including critical care time)  DIAGNOSTIC STUDIES: Oxygen Saturation is 100% on room air, normal by my interpretation.    COORDINATION OF CARE:  18:48- Discussed planned course of treatment with the patient, including blood work, left knee X-ray, and a psychiatric evaluation, who is agreeable at this time.  Results for orders placed during the hospital encounter of 04/09/12  CBC WITH DIFFERENTIAL      Component Value Range   WBC 8.8  4.0 - 10.5 K/uL   RBC 4.43  3.87 - 5.11 MIL/uL   Hemoglobin 12.7  12.0 - 15.0 g/dL   HCT 16.1  09.6 - 04.5 %   MCV 89.8  78.0 - 100.0 fL   MCH 28.7  26.0 - 34.0 pg   MCHC 31.9  30.0 - 36.0 g/dL   RDW 40.9  81.1 - 91.4 %   Platelets 277  150 - 400 K/uL   Neutrophils Relative 65  43 - 77 %   Lymphocytes Relative 24  12 - 46 %   Monocytes Relative 9  3 - 12 %   Eosinophils Relative 1  0 - 5 %  Basophils Relative 1  0 - 1 %   Smear Review       Value: PLATELET CLUMPS NOTED ON SMEAR, COUNT APPEARS ADEQUATE   Neutro Abs 5.7  1.7 - 7.7 K/uL   Lymphs Abs 2.1  0.7 - 4.0 K/uL   Monocytes Absolute 0.8  0.1 - 1.0 K/uL   Eosinophils Absolute 0.1  0.0 - 0.7 K/uL   Basophils Absolute 0.1  0.0 - 0.1 K/uL  COMPREHENSIVE METABOLIC PANEL      Component Value Range    Sodium 137  135 - 145 mEq/L   Potassium 3.6  3.5 - 5.1 mEq/L   Chloride 101  96 - 112 mEq/L   CO2 26  19 - 32 mEq/L   Glucose, Bld 100 (*) 70 - 99 mg/dL   BUN 5 (*) 6 - 23 mg/dL   Creatinine, Ser 1.61 (*) 0.50 - 1.10 mg/dL   Calcium 9.3  8.4 - 09.6 mg/dL   Total Protein 7.8  6.0 - 8.3 g/dL   Albumin 3.9  3.5 - 5.2 g/dL   AST 15  0 - 37 U/L   ALT 12  0 - 35 U/L   Alkaline Phosphatase 161 (*) 39 - 117 U/L   Total Bilirubin 0.2 (*) 0.3 - 1.2 mg/dL   GFR calc non Af Amer >90  >90 mL/min   GFR calc Af Amer >90  >90 mL/min  ETHANOL      Component Value Range   Alcohol, Ethyl (B) <11  0 - 11 mg/dL  URINALYSIS, ROUTINE W REFLEX MICROSCOPIC      Component Value Range   Color, Urine YELLOW  YELLOW   APPearance CLEAR  CLEAR   Specific Gravity, Urine 1.025  1.005 - 1.030   pH 6.0  5.0 - 8.0   Glucose, UA NEGATIVE  NEGATIVE mg/dL   Hgb urine dipstick NEGATIVE  NEGATIVE   Bilirubin Urine NEGATIVE  NEGATIVE   Ketones, ur NEGATIVE  NEGATIVE mg/dL   Protein, ur NEGATIVE  NEGATIVE mg/dL   Urobilinogen, UA 0.2  0.0 - 1.0 mg/dL   Nitrite POSITIVE (*) NEGATIVE   Leukocytes, UA SMALL (*) NEGATIVE  URINE RAPID DRUG SCREEN (HOSP PERFORMED)      Component Value Range   Opiates NONE DETECTED  NONE DETECTED   Cocaine NONE DETECTED  NONE DETECTED   Benzodiazepines NONE DETECTED  NONE DETECTED   Amphetamines NONE DETECTED  NONE DETECTED   Tetrahydrocannabinol NONE DETECTED  NONE DETECTED   Barbiturates POSITIVE (*) NONE DETECTED  ACETAMINOPHEN LEVEL      Component Value Range   Acetaminophen (Tylenol), Serum <15.0  10 - 30 ug/mL  SALICYLATE LEVEL      Component Value Range   Salicylate Lvl <2.0 (*) 2.8 - 20.0 mg/dL  POCT PREGNANCY, URINE      Component Value Range   Preg Test, Ur NEGATIVE  NEGATIVE  URINE MICROSCOPIC-ADD ON      Component Value Range   Squamous Epithelial / LPF RARE  RARE   WBC, UA TOO NUMEROUS TO COUNT  <3 WBC/hpf   Bacteria, UA MANY (*) RARE   Dg Knee Complete 4 Views  Left  04/09/2012  *RADIOLOGY REPORT*  Clinical Data: Fall. Knee injury and pain.  LEFT KNEE - COMPLETE 4+ VIEW  Comparison: None.  Findings: A leg brace is seen overlying the tibia. Anterior soft tissue swelling is noted.  No evidence of knee joint effusion.  No evidence of fracture or dislocation.  No evidence of  joint space narrowing.  No other significant bone abnormality identified.  IMPRESSION: Anterior soft tissue swelling.  No osseous abnormality.   Original Report Authenticated By: Myles Rosenthal, M.D.     1. Suicidal ideation   2. Depression   3. Hallucinations   4. UTI (lower urinary tract infection)     MDM  Patient with vague suicidal ideation. I've spoken with the owner of the group home where she stays who feels that she is just looking for attention. However, psychiatric consultation will need to be obtained.  I personally performed the services described in this documentation, which was scribed in my presence. The recorded information has been reviewed and is accurate.      Lori Booze, MD 04/15/12 [phone]

## 2012-04-09 NOTE — ED Notes (Signed)
POTC preg test negative.

## 2012-04-09 NOTE — ED Notes (Signed)
Pt states she was at [facility] and was brought here for eval because she said she " might do something to herself "

## 2012-04-10 MED ORDER — PANTOPRAZOLE SODIUM 40 MG PO TBEC
40.0000 mg | DELAYED_RELEASE_TABLET | Freq: Every day | ORAL | Status: DC
Start: 2012-04-10 — End: 2012-04-11
  Administered 2012-04-10 – 2012-04-11 (×2): 40 mg via ORAL
  Filled 2012-04-10 (×2): qty 1

## 2012-04-10 MED ORDER — POLYVINYL ALCOHOL 1.4 % OP SOLN
1.0000 [drp] | Freq: Every day | OPHTHALMIC | Status: DC | PRN
  Filled 2012-04-10: qty 15

## 2012-04-10 MED ORDER — NITROFURANTOIN MACROCRYSTAL 100 MG PO CAPS
100.0000 mg | ORAL_CAPSULE | Freq: Once | ORAL | Status: AC
  Administered 2012-04-10: 100 mg via ORAL
  Filled 2012-04-10: qty 1

## 2012-04-10 MED ORDER — LURASIDONE HCL 120 MG PO TABS
120.0000 mg | ORAL_TABLET | Freq: Every day | ORAL | Status: DC
  Administered 2012-04-10: 120 mg via ORAL
  Filled 2012-04-10 (×2): qty 1

## 2012-04-10 NOTE — ED Notes (Signed)
ACT team member Alvino Chapel called and sd that [facility] would not have any beds available today.
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
## 2012-04-10 NOTE — ED Notes (Signed)
Pt resting calmly w/ eyes closed. Rise & fall of the chest noted. Sitter at bedside. Bed in low position, side rails up x2. NAD noted at this time.  

## 2012-04-10 NOTE — ED Notes (Signed)
ACT team,Lori [name], assessed pt this am. ACT team discussed placement and other outside options with pt. Pt reports "it is not safe for me to go back to the group home." pt refused to sign contract for safety. Pt is still SI with a plan and hallucinations; auditory and visual. ACT Team, Lori [name], to follow up with [facility] with placement status. Notify [facility] by noon with bed assignment/plan of care.

## 2012-04-10 NOTE — ED Notes (Signed)
Pt has been accepted at [facility] pending bed availability, currently there are no beds available. Accepting is Dr Daleen Bo, and Donell Sievert PA

## 2012-04-10 NOTE — Progress Notes (Signed)
0050 Assumed care/disposition of patient. Patient presents with auditory hallucinations, depression and SI. She is a resident of group home. Prior to group home she was cared for by her mother who died 4 years ago. This is the anniversary of her death. Patient has been evaluated by telepsych who recommended hospitalization. She was seen by ACT who is familiar with the patient and feels she is at her baseline. Dr. Preston Fleeting spoke with the guardian who advised last hospitalization was 4 years ago at [facility]. Paperwork has been submitted to [facility]. Hattie Perch, ACT recommended patient be re-evaluated in the morning to determine if she feels she can contract for safety. She could then return to the group home and follow up at [facility]. 1610 Nursing advised that patient has been accepted at [facility] by Dr. Meryl Crutch. No beds available at this time. They asked that UTI be treated. Macrodantin ordered.

## 2012-04-11 ENCOUNTER — Inpatient Hospital Stay (HOSPITAL_COMMUNITY)
Admission: EM | Admit: 2012-04-11 | Discharge: 2012-05-02 | DRG: 885 | Disposition: A | Discharge status: Other Acute Inpatient Hospital | Payer: Medicare Other | Source: Ambulatory Visit | Attending: Emergency Medicine | Admitting: Emergency Medicine

## 2012-04-11 DIAGNOSIS — F323 Major depressive disorder, single episode, severe with psychotic features: Secondary | ICD-10-CM

## 2012-04-11 DIAGNOSIS — F333 Major depressive disorder, recurrent, severe with psychotic symptoms: Secondary | ICD-10-CM | POA: Diagnosis present

## 2012-04-11 DIAGNOSIS — R0789 Other chest pain: Secondary | ICD-10-CM

## 2012-04-11 DIAGNOSIS — F17200 Nicotine dependence, unspecified, uncomplicated: Secondary | ICD-10-CM | POA: Diagnosis present

## 2012-04-11 DIAGNOSIS — R45851 Suicidal ideations: Secondary | ICD-10-CM

## 2012-04-11 DIAGNOSIS — R32 Unspecified urinary incontinence: Secondary | ICD-10-CM | POA: Diagnosis present

## 2012-04-11 DIAGNOSIS — Z79899 Other long term (current) drug therapy: Secondary | ICD-10-CM

## 2012-04-11 DIAGNOSIS — N390 Urinary tract infection, site not specified: Secondary | ICD-10-CM

## 2012-04-11 DIAGNOSIS — R6250 Unspecified lack of expected normal physiological development in childhood: Secondary | ICD-10-CM | POA: Diagnosis present

## 2012-04-11 MED ORDER — POLYETHYLENE GLYCOL 3350 17 G PO PACK
17.0000 g | PACK | Freq: Every day | ORAL | Status: DC
  Administered 2012-04-21 – 2012-04-27 (×2): 17 g via ORAL
  Filled 2012-04-11 (×23): qty 1

## 2012-04-11 MED ORDER — SELENIUM SULFIDE 2.5 % EX LOTN
TOPICAL_LOTION | Freq: Every day | CUTANEOUS | Status: DC | PRN
  Filled 2012-04-11: qty 120

## 2012-04-11 MED ORDER — LURASIDONE HCL 80 MG PO TABS
120.0000 mg | ORAL_TABLET | Freq: Every day | ORAL | Status: DC
  Administered 2012-04-12 – 2012-04-15 (×3): 120 mg via ORAL
  Filled 2012-04-11 (×8): qty 1

## 2012-04-11 MED ORDER — ACETAMINOPHEN 325 MG PO TABS
650.0000 mg | ORAL_TABLET | Freq: Four times a day (QID) | ORAL | Status: DC | PRN
  Administered 2012-04-11: 650 mg via ORAL

## 2012-04-11 MED ORDER — CITALOPRAM HYDROBROMIDE 40 MG PO TABS
40.0000 mg | ORAL_TABLET | Freq: Every day | ORAL | Status: DC
  Filled 2012-04-11: qty 1

## 2012-04-11 MED ORDER — SIMVASTATIN 20 MG PO TABS
20.0000 mg | ORAL_TABLET | Freq: Every day | ORAL | Status: DC
  Administered 2012-04-12 – 2012-05-01 (×18): 20 mg via ORAL
  Filled 2012-04-11 (×23): qty 1

## 2012-04-11 MED ORDER — BELLADONNA ALK-PHENOBARBITAL 16.2 MG PO TABS: 64.8000 | ORAL_TABLET | Freq: Every day | ORAL | Status: DC

## 2012-04-11 MED ORDER — LORATADINE 10 MG PO TABS
10.0000 mg | ORAL_TABLET | Freq: Every day | ORAL | Status: DC
  Administered 2012-04-12 – 2012-05-02 (×20): 10 mg via ORAL
  Filled 2012-04-11 (×23): qty 1

## 2012-04-11 MED ORDER — CIPROFLOXACIN HCL 250 MG PO TABS
500.0000 mg | ORAL_TABLET | Freq: Two times a day (BID) | ORAL | Status: DC
  Administered 2012-04-11: 500 mg via ORAL
  Filled 2012-04-11: qty 2

## 2012-04-11 MED ORDER — ALUM & MAG HYDROXIDE-SIMETH 200-200-20 MG/5ML PO SUSP
30.0000 mL | ORAL | Status: DC | PRN
  Administered 2012-04-13 – 2012-04-23 (×3): 30 mL via ORAL

## 2012-04-11 MED ORDER — CHLORPROMAZINE HCL 25 MG PO TABS
100.0000 mg | ORAL_TABLET | Freq: Three times a day (TID) | ORAL | Status: DC | PRN
  Administered 2012-04-13 – 2012-04-18 (×3): 100 mg via ORAL
  Administered 2012-04-19: 25 mg via ORAL
  Administered 2012-04-19: 100 mg via ORAL
  Administered 2012-04-19: 75 mg via ORAL
  Administered 2012-04-20 – 2012-05-01 (×13): 100 mg via ORAL
  Filled 2012-04-11 (×3): qty 4
  Filled 2012-04-11: qty 3
  Filled 2012-04-11 (×4): qty 4
  Filled 2012-04-11: qty 3
  Filled 2012-04-11: qty 1
  Filled 2012-04-11: qty 4
  Filled 2012-04-11: qty 3
  Filled 2012-04-11: qty 4
  Filled 2012-04-11: qty 1
  Filled 2012-04-11: qty 4
  Filled 2012-04-11: qty 3
  Filled 2012-04-11: qty 4
  Filled 2012-04-11: qty 1
  Filled 2012-04-11: qty 4
  Filled 2012-04-11: qty 1
  Filled 2012-04-11: qty 4
  Filled 2012-04-11: qty 1

## 2012-04-11 MED ORDER — OXYCODONE-ACETAMINOPHEN 5-325 MG PO TABS
1.0000 | ORAL_TABLET | Freq: Four times a day (QID) | ORAL | Status: DC | PRN
  Administered 2012-04-12 – 2012-05-02 (×25): 1 via ORAL
  Filled 2012-04-11 (×27): qty 1

## 2012-04-11 MED ORDER — PANTOPRAZOLE SODIUM 20 MG PO TBEC
20.0000 mg | DELAYED_RELEASE_TABLET | Freq: Every day | ORAL | Status: DC
  Administered 2012-04-12 – 2012-05-02 (×20): 20 mg via ORAL
  Filled 2012-04-11 (×23): qty 1

## 2012-04-11 MED ORDER — CALAMINE EX LOTN: TOPICAL_LOTION | Freq: Three times a day (TID) | CUTANEOUS | Status: DC | PRN

## 2012-04-11 MED ORDER — MAGNESIUM HYDROXIDE 400 MG/5ML PO SUSP: 30.0000 mL | Freq: Every day | ORAL | Status: DC | PRN

## 2012-04-11 MED ORDER — CEPHALEXIN 500 MG PO CAPS
500.0000 mg | ORAL_CAPSULE | Freq: Three times a day (TID) | ORAL | Status: DC
  Administered 2012-04-12 – 2012-04-24 (×35): 500 mg via ORAL
  Filled 2012-04-11 (×13): qty 1
  Filled 2012-04-11: qty 2
  Filled 2012-04-11 (×34): qty 1

## 2012-04-11 NOTE — ED Notes (Signed)
Pt resting calmly w/ eyes closed. Rise & fall of the chest noted. Sitter remains w/ pt. Bed in low position, side rails up x2. NAD noted at this time.  

## 2012-04-11 NOTE — Progress Notes (Signed)
Patient returned from group karaoke crying and very upset. Patient stated "I am worthless, a horrible retard that can't get nothing right. I wish I was dead, I wish I had a gun and I could kill myself." Patient was upset because she "messed up the words while singing at [facility]." Patient continued to cry and make statements about killing herself and wishing she were dead. Re-directed patients conversation, patient began to calm down. Offered patient encouragement and support. Patient remains on 1:1 due to continued suicidal ideation and patients inability to contract for safety at this time. Sitter at patients bedside, will continue to monitor patient.

## 2012-04-11 NOTE — ED Provider Notes (Signed)
The patient has been resting without discomfort, urinalysis is consistent with a Pseudomonas urinary infection, ciprofloxacin ordered twice a day for 5 days, added to diagnosis this, patient will need to be treated for the residual course of 5 days, culture sensitivities are pending at this time.  No [facility] placement secured at this time.  To be evaluated in the AM by ACT team.  Vida Roller, MD 04/11/12 [id number]

## 2012-04-11 NOTE — ED Notes (Signed)
Report given to [facility] RN at [facility],

## 2012-04-11 NOTE — ED Notes (Signed)
Patient knows when she has to urinate or have a bowel movement but will not alert staff to her needs.

## 2012-04-11 NOTE — Progress Notes (Signed)
Patient calmly resting in bed, patient stated "I feel much better after I had my bath." Patient continues to have suicidal thoughts but is calm and resting quietly, patient states she "is going to sleep." Sitter at patients bedside. Will continue to monitor patient.

## 2012-04-11 NOTE — Progress Notes (Signed)
Patient admitted to unit for Suicide Ideation and hallucinations visual and auditory. Patient continues to have suicidal thoughts but verbally contracts for safety. Per patient when she was at a doctors appointment they were asking her about her life and her childhood and talking about that made her very depressed and suicidal. Patient mobilized via wheelchair due to history of spinal bifida, patient also wears leg braces on bilateral legs. Patient attending group karaoke, cheerful and happy at this time. Patient placed on 1 : 1 due to manipulative behaviors and continued thoughts of suicidal ideation. Sitter with patient, safety maintained. Will continue to monitor patient.

## 2012-04-11 NOTE — ED Notes (Signed)
Pt sitting in bed, coloring, sitter remains at bedside,

## 2012-04-11 NOTE — ED Notes (Signed)
Pt has bed number at [facility] 500-2. Call report to  [phone], they will call us when bed is ready

## 2012-04-11 NOTE — ED Notes (Signed)
Pt given lunch tray, sitter remains at bedside,  

## 2012-04-11 NOTE — ED Notes (Signed)
Chart opened to fill out additional paperwork for commitment.

## 2012-04-11 NOTE — Progress Notes (Signed)
04/11/12 11:40 AM Pt request that no information be given to Latoya who is the house mother of her group home, or anyone at the group home as she hopes to not return there. She said that it is ok to talk to [employer], guarantor, about her and her sister Lori [name]. Tried to return a call to [employer], but there was no answer.

## 2012-04-12 MED ORDER — PHENOBARBITAL 32.4 MG PO TABS
64.8000 mg | ORAL_TABLET | Freq: Every day | ORAL | Status: DC
  Administered 2012-04-12 – 2012-05-01 (×19): 64.8 mg via ORAL
  Filled 2012-04-12 (×13): qty 2
  Filled 2012-04-12: qty 1
  Filled 2012-04-12 (×5): qty 2
  Filled 2012-04-12: qty 1
  Filled 2012-04-12 (×2): qty 2

## 2012-04-12 MED ORDER — CITALOPRAM HYDROBROMIDE 40 MG PO TABS
40.0000 mg | ORAL_TABLET | Freq: Every day | ORAL | Status: DC
  Administered 2012-04-12 – 2012-05-01 (×19): 40 mg via ORAL
  Filled 2012-04-12 (×18): qty 1
  Filled 2012-04-12: qty 4
  Filled 2012-04-12 (×4): qty 1

## 2012-04-12 NOTE — Progress Notes (Signed)
Late entry 1:1 note  Patient has been up in the dayroom coloring. Patient attended group this evening and participated. Patient received her 2000 medication and was informed of her scheduled 2200 medications. Patient reported that her day started off bad but got better. Patient was informed of her not being allowed to go to the cafeteria d/t being on a 1:1. Patient was informed that this was a rule of the unit and she was understanding. Patent currently denies si and reports auditory hallucinations of her family telling her she is no good. Patient reports visual hallucinations of rainbow colored dragons flying. Patient remains safe and 1:1 continues. Will continue to monitor.

## 2012-04-12 NOTE — [facility] Suicide Risk Assessment (Signed)
Suicide Risk Assessment  Admission Assessment     Nursing information obtained from:    Demographic factors:    Current Mental Status:   Patient endorsing active suicidal thoughts with plan to slit throat. Loss Factors:    Historical Factors:   Long history of depression and living at group home. Risk Reduction Factors:     CLINICAL FACTORS:   Depression:   Anhedonia Hopelessness Impulsivity Insomnia Severe  COGNITIVE FEATURES THAT CONTRIBUTE TO RISK:  Thought constriction (tunnel vision)    SUICIDE RISK:   Moderate:  Frequent suicidal ideation with limited intensity, and duration, some specificity in terms of plans, no associated intent, good self-control, limited dysphoria/symptomatology, some risk factors present, and identifiable protective factors, including available and accessible social support.  PLAN OF CARE: Ensure patient`s safety with 1:1 as necessary, initiate appropriate medication management. Discharge when stable.   Lori [name] 04/12/2012, 10:37 AM

## 2012-04-12 NOTE — Progress Notes (Signed)
[facility] LCSW Group Therapy  04/12/2012 6:19 PM  Type of Therapy:  Group Therapy  Participation Level:  Active  Participation Quality:  Intrusive, Monopolizing and Sharing  Affect:  Labile and Tearful  Cognitive:  Alert  Insight:  Limited  Engagement in Therapy:  Monopolizing  Modes of Intervention:  Limit-setting, Orientation and Support  Summary of Progress/Problems:  Focus of group therapy session was to identify what balance would look like for individual patients after discharge.  Patients choose from group of photographs a photo that had meaning for them in reference to balance. Patient chose a photo of an eye close up and shared how important it is to look someone in the eye. Patient was tearful at multiple points and shared that family had told her she is "Ugly, unlovable and dumb" over the years.  Patient shared she is engaged to be married to her girl friend and hopes to adopt three children three times for a total of 9 children.    Lori [name] 04/12/2012, 6:19 PM

## 2012-04-12 NOTE — Progress Notes (Signed)
Psychoeducational Group Note  Date:  04/12/2012 Time:  2000  Group Topic/Focus:  Karaoke night   Participation Level:  Active  Participation Quality:  Appropriate  Affect:  Appropriate  Cognitive:  Appropriate  Insight:  Engaged  Engagement in Group:  Engaged  Additional Comments:  Pt attended karaoke group and participated.   Zeta Bucy A 04/12/2012, 3:30 AM
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
## 2012-04-12 NOTE — Progress Notes (Signed)
Psychoeducational Group Note  Date:  04/12/2012 Time:  2000  Group Topic/Focus:  Goals Group:   The focus of this group is to help patients establish daily goals to achieve during treatment and discuss how the patient can incorporate goal setting into their daily lives to aide in recovery.  Participation Level:  Active  Participation Quality:  Appropriate  Affect:  Appropriate  Cognitive:  Appropriate  Insight:  Developing/Improving  Engagement in Group:  Developing/Improving  Additional Comments:  Patient shared with the group that she was upset about not being able to go down to the dinning room. Patient was informed about the rules regarding having a sitter and location of the menu on the hall. Patient was receptive to the idea of letting mht know what she would like to eat prior to each meal. Patients goal is to complete a letter that written to her girlfriend to keep their "relationship going."  Lyndee Hensen 04/12/2012, 9:21 PM

## 2012-04-12 NOTE — Progress Notes (Signed)
Pt started to cry when she found out she could not go to the cafeteria -pt stated she would kill her by sticking her hand in the socket if not permitted to go to the cafeteria. One of the nurses ate lunch with the pt and she appeared content. [facility] stated [facility] and at that time became engaged to a lady she has not seen times 11 months. Pt has grandoise ideas stating she will marry this lady ,move to [place] ,become a Programmer, multimedia and adopt a total of 9 children from all different places primarily [place]. Pt remains in a Wheelchair and tech removed her shoes as pt stated she would strangle herself with the shoelaces. Pt remains a 1:1 and remains safe. Continues to endorse SI feelings but some of this might be attention seeking behavior evident by pt stating if we let her eat in the cafeteria she would not kill herself. At times pt does appear limited.

## 2012-04-12 NOTE — Progress Notes (Signed)
Psychoeducational Group Note  Date:  04/12/2012 Time:  1000  Group Topic/Focus:  Wellness Toolbox:   The focus of this group is to discuss various aspects of wellness, balancing those aspects and exploring ways to increase the ability to experience wellness.  Patients will create a wellness toolbox for use upon discharge.  Participation Level:  Did Not Attend  Participation Quality:    Affect:    Cognitive:    Insight:    Engagement in Group:    Additional Comments: pt was in the room  Lori [name] 04/12/2012, 2:15 PM
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
## 2012-04-12 NOTE — Progress Notes (Signed)
Psychoeducational Group Note  Date:  04/12/2012 Time:  1100  Group Topic/Focus:  Early Warning Signs:   The focus of this group is to help patients identify signs or symptoms they exhibit before slipping into an unhealthy state or crisis.  Participation Level: Did Not Attend  Participation Quality:  Not Applicable  Affect:  Not Applicable  Cognitive:  Not Applicable  Insight:  Not Applicable  Engagement in Group: Not Applicable  Additional Comments:  Pt refused to attend group this morning.  Kainalu Heggs E 04/12/2012, 1:38 PM

## 2012-04-12 NOTE — Progress Notes (Signed)
Patient ID: Lori [name], female   DOB: [birth year], 41 y.o.   MRN: [id number]   PER STATE REGULATIONS 482.30  THIS CHART WAS REVIEWED FOR MEDICAL NECESSITY WITH RESPECT TO THE PATIENT'S ADMISSION/ DURATION OF STAY.  NEXT REVIEW DATE: 04/15/2012  Willa Rough, RN, BSN CASE MANAGER
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
## 2012-04-12 NOTE — Progress Notes (Signed)
[facility] LCSW Aftercare Discharge Planning Group Note  04/12/2012 12:57 PM  Participation Quality:  Did not attend.    Nail, Catalina Gravel 04/12/2012, 12:57 PM

## 2012-04-12 NOTE — Progress Notes (Signed)
Nrsg 1:1 for 0815 at 0900 D Pt observed lying in her bed ..talking animatedly to her 1;1 attendant. She makes eye contact ..her mood is not depressed and she is talking fluently  With this nurse and her MHT. She says " I am waiting for my breakfast..ibuprofen am hungry".   A She completed her AM self inventory and on it she wrote she has had " constant SI within the past 24 hrs".    R 1:1 cont per POC and safety maintained. Marland Kitchen

## 2012-04-13 LAB — URINE CULTURE

## 2012-04-13 NOTE — Progress Notes (Signed)
[facility] MD Progress Note  04/13/2012 2:09 PM Lori [name]  MRN:  [id number] Subjective:  Offers that she is no longer suicidal and would like to be allowed to go to the cafeteria. Staff find her manipulative and high maintenance. Will require help with ADL's but does not require 1:1 anymore for safety.  Denies any need for med changes or adjustments.  Diagnosis:  Axis I: MDD (major depressive disorder), recurrent, severe, with psychosis  ADL's:  Impaired  Sleep: Good  Appetite:  Good  Suicidal Ideation:  Plan:  denies  Intent:  denies at this time Means:  has been on 1:1 Homicidal Ideation:  Denies  AEB (as evidenced by):  Psychiatric Specialty Exam: Review of Systems  Constitutional: Negative.   HENT: Negative.   Eyes: Negative.   Respiratory: Negative.   Cardiovascular: Negative.   Gastrointestinal: Negative.   Genitourinary: Positive for dysuria.  Musculoskeletal: Positive for myalgias.  Skin: Negative.   Neurological: Negative.   Endo/Heme/Allergies: Negative.   Psychiatric/Behavioral: Positive for depression.    Blood pressure 101/65, pulse 103, temperature 99.2 F (37.3 C), resp. rate 18.There is no height or weight on file to calculate BMI.  General Appearance: Fairly Groomed  Patent attorney::  Good  Speech:  Clear and Coherent  Volume:  Normal  Mood:  Angry, Anxious, Depressed and Irritable  Affect:  Congruent  Thought Process:  Goal Directed, Loose and Tangential  Orientation:  Full (Time, Place, and Person)  Thought Content:  Rumination  Suicidal Thoughts:  No  Homicidal Thoughts:  No  Memory : intact   Judgement:  Fair  Insight:  Shallow  Psychomotor Activity:  in wheelchair has leg braces   Concentration:  Good  Recall:  Good  Akathisia:  No  Handed:  Right  AIMS (if indicated):     Assets:  Communication Skills Resilience  Sleep:  Number of Hours: 2.25    Current Medications: Current Facility-Administered Medications  Medication Dose Route  Frequency Provider Last Rate Last Dose  . acetaminophen (TYLENOL) tablet 650 mg  650 mg Oral Q6H PRN Verne Spurr, PA-C   650 mg at 04/11/12 2248  . alum & mag hydroxide-simeth (MAALOX/MYLANTA) 200-200-20 MG/5ML suspension 30 mL  30 mL Oral Q4H PRN Verne Spurr, PA-C      . calamine lotion   Topical TID PRN Nelly Rout, MD      . cephALEXin (KEFLEX) capsule 500 mg  500 mg Oral TID Nelly Rout, MD   500 mg at 04/13/12 0818  . chlorproMAZINE (THORAZINE) tablet 100 mg  100 mg Oral TID PRN Nelly Rout, MD      . citalopram (CELEXA) tablet 40 mg  40 mg Oral QHS Nelly Rout, MD   40 mg at 04/12/12 2136  . loratadine (CLARITIN) tablet 10 mg  10 mg Oral Daily Nelly Rout, MD   10 mg at 04/13/12 0818  . lurasidone (LATUDA) tablet 120 mg  120 mg Oral QHS Nelly Rout, MD   120 mg at 04/12/12 2136  . magnesium hydroxide (MILK OF MAGNESIA) suspension 30 mL  30 mL Oral Daily PRN Verne Spurr, PA-C      . oxyCODONE-acetaminophen (PERCOCET/ROXICET) 5-325 MG per tablet 1 tablet  1 tablet Oral Q6H PRN Nelly Rout, MD   1 tablet at 04/13/12 0902  . pantoprazole (PROTONIX) EC tablet 20 mg  20 mg Oral Daily Nelly Rout, MD   20 mg at 04/13/12 0818  . PHENobarbital (LUMINAL) tablet 64.8 mg  64.8 mg Oral QHS Nelly Rout,  MD   64.8 mg at 04/12/12 2135  . polyethylene glycol (MIRALAX / GLYCOLAX) packet 17 g  17 g Oral Daily Nelly Rout, MD      . selenium sulfide (SELSUN) 2.5 % shampoo   Topical Daily PRN Nelly Rout, MD      . simvastatin (ZOCOR) tablet 20 mg  20 mg Oral q1800 Nelly Rout, MD   20 mg at 04/12/12 1716    Lab Results: No results found for this or any previous visit (from the past 48 hour(s)).  Physical Findings: AIMS: Facial and Oral Movements Muscles of Facial Expression: None, normal Lips and Perioral Area: None, normal Jaw: None, normal Tongue: None, normal,Extremity Movements Upper (arms, wrists, hands, fingers): None, normal Lower (legs, knees, ankles, toes): None,  normal, Trunk Movements Neck, shoulders, hips: None, normal, Overall Severity Severity of abnormal movements (highest score from questions above): None, normal Incapacitation due to abnormal movements: None, normal Patient's awareness of abnormal movements (rate only patient's report): No Awareness, Dental Status Current problems with teeth and/or dentures?: No Does patient usually wear dentures?: No  CIWA:    COWS:     Treatment Plan Summary: Daily contact with patient to assess and evaluate symptoms and progress in treatment Medication management help wih attaining highest level of performing own ADL's  Plan:  Medical Decision Making Problem Points:  Established problem, stable/improving (1), New problem, with additional work-up planned (4), Review of last therapy session (1) and Review of psycho-social stressors (1) Data Points:  Review and summation of old records (2) Review of medication regiment & side effects (2) Review of new medications or change in dosage (2)  I certify that inpatient services furnished can reasonably be expected to improve the patient's condition.   Lori [name],Lori D. 04/13/2012, 2:09 PM

## 2012-04-13 NOTE — Progress Notes (Signed)
1:1 Nursing note- Patient lying in bed asleep eyes closed respirations even and unlabored no distress noted, safety maintained with MHT at bedside. Patient is safe, will continue to monitor. 1:1 continues.
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
## 2012-04-13 NOTE — Progress Notes (Signed)
Psychoeducational Group Note  Date: 04/13/2012 Time:  1015  Group Topic/Focus:  Identifying Needs:   The focus of this group is to help patients identify their personal needs that have been historically problematic and identify healthy behaviors to address their needs.  Participation Level:  Active  Participation Quality:  When Pt was not getting the attention she left. Came in and out of the group several times and finally left altogether  Affect:  Flat  Cognitive:  Lacking  Insight:  Poor  Engagement in Group:  Limited  Additional Comments:    Dione Housekeeper

## 2012-04-13 NOTE — Progress Notes (Signed)
Writer entered patients room and she appeared to be asleep but opened her eyes when Clinical research associate spoke with sitter. Sitter reports patient had been up to the restroom shortly before Clinical research associate entered the room.  Patient voiced no complaints and closed her eyes to return to resting. 1:1 continues, patient remains safe. Will continue to monitor.

## 2012-04-13 NOTE — Progress Notes (Signed)
Psychoeducational Group Note  Date:  04/13/2012 Time:  2000  Group Topic/Focus:  Wrap-Up Group:   The focus of this group is to help patients review their daily goal of treatment and discuss progress on daily workbooks.  Participation Level: Did Not Attend  Participation Quality:  Not Applicable  Affect:  Not Applicable  Cognitive:  Not Applicable  Insight:  Not Applicable  Engagement in Group: Not Applicable  Additional Comments:  The patient remained her during group.   Hazle Coca S 04/13/2012, 9:54 PM

## 2012-04-13 NOTE — Progress Notes (Signed)
1:1 nursing note - Writer entered patients room and observed her lying in bed preparing to rest. Sitter had assisted patient to bed. Patient has had questions as to why she is still on the 1:1 and writer explained to her earlier her reason remaining on the 1:1. Patient was encouraged to speak with the doctor or nurse practitioner on tomorrow concerning this matter because nurses are not the one to make the decision to take a patient off a 1:1. Patient has been in and out of the dayroom not interacting with peers, complaining that the other patients are not being respectful off her. Writer had to medicate and de-escalate patient shortly after shift change because she accused another patient of throwing away her cup she left on the table in the dayroom. Patient offered support and encouragement offered. Patient currently denies si/hi/a/v hallucinations. 1:1 continues, patient remains safe. Will continue to monitor.

## 2012-04-13 NOTE — Progress Notes (Signed)
Goals Group This is a group that identifies the program and helps them to start working on their packets for the day.  Each person sets a goal for the day that is measurable Pt's goal today was to stay in the present and not dwell on the past.

## 2012-04-13 NOTE — Clinical Social Work Note (Signed)
[facility] Group Notes:  (Clinical Social Work)  04/13/2012   3:00-4:00PM  Summary of Progress/Problems:   The main focus of today's process group was for the patient to identify ways in which they have in the past sabotaged their own recovery and reasons they may have done this/what they received from doing it.  We then worked to identify a specific plan to avoid doing this when discharged from the hospital for this admission.  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  She talked about replacing her self-sabotaging thoughts with positive self-talk about not making the wrong choices.  She was out of the room for much of the group session.  Type of Therapy:  Group Therapy - Process  Participation Level:  Minimal  Participation Quality:  Attentive  Affect:  Blunted  Cognitive:  Oriented  Insight:  Engaged  Engagement in Therapy:  Engaged  Modes of Intervention:  Clarification, Education, Limit-setting, Problem-solving, Socialization, Support and Processing, Exploration, Discussion   Ambrose Mantle, LCSW 04/13/2012, 4:05 PM

## 2012-04-14 DIAGNOSIS — F333 Major depressive disorder, recurrent, severe with psychotic symptoms: Secondary | ICD-10-CM | POA: Diagnosis present

## 2012-04-14 NOTE — Progress Notes (Signed)
1:1 nursing note - Patient currently lying in bed asleep with eyes closed, respirations even and unlabored, no distress noted, 1:1 continues, patient remains safe.

## 2012-04-14 NOTE — Progress Notes (Signed)
Psychoeducational Group Note  Date:  04/14/2012 Time:  0115  Group Topic/Focus: Developing Support-system; The Ten Secrets of Happiness.  Participation Level:  Active  Participation Quality:  Monopolizing  Affect:  Excited  Cognitive:  Alert  Insight:  Monopolizing  Engagement in Group:  Distracting  Additional Comments:  Patient shared some ideas about what she could do to help add to her happiness. Patient very rude and distracting when other group participants attempted to share.  Aadvika Konen, Newton Pigg 04/14/2012, 2:21 PM

## 2012-04-14 NOTE — Progress Notes (Signed)
1:1 Note Late Entry for 1800  D) Pt. Went down to dinner and came back unhappy and upset that the dinner "sucked. It was cold". Pt is inappropriate in her responses and internalizes easily. A) Pt remains on a 1:1 for her safety R) Denies SI and HI. Remains safe.

## 2012-04-14 NOTE — Clinical Social Work Note (Signed)
[facility] Group Notes: (Clinical Social Work)   04/14/2012      Type of Therapy:  Group Therapy   Participation Level:  Did Not Attend    Armarion Greek Grossman-Orr, LCSW 04/14/2012, 4:36 PM     

## 2012-04-14 NOTE — Progress Notes (Signed)
Late entry 1:1 nursing note- Patient has been observed by writer sitting in the dayroom interacting with select peers. Patient has refused all of her medications this evening and reported that she would not be giving a urine sample when asked at beginning of shift. Patient chooses to communicate with her sitter and not her nurse this evening. Patient denies si/hi/a/v hall. Support offered, patient remains safe and 1:1 continues.

## 2012-04-14 NOTE — Progress Notes (Signed)
Psychoeducational Group Note  Date:  04/14/2012 Time:  1015  Group Topic/Focus:  Making Healthy Choices:   The focus of this group is to help patients identify negative/unhealthy choices they were using prior to admission and identify positive/healthier coping strategies to replace them upon discharge.  Participation Level:  Did Not Attend   Slyvia Lartigue A 04/14/2012  

## 2012-04-14 NOTE — Progress Notes (Signed)
[facility] Group Notes:  (Counselor/Nursing/MHT/Case Management/Adjunct)  04/14/2012 9:39 PM  Type of Therapy:  Psychoeducational Skills  Participation Level:  Active  Participation Quality:  Appropriate  Affect:  Excited  Cognitive:  Disorganized  Insight:  Monopolizing  Engagement in Group:  Off Topic  Engagement in Therapy:  Off Topic  Modes of Intervention:  Education  Summary of Progress/Problems: The patient verbalized that she didn't have a very good day. She stated that she only had one good meal today. She did mention that she enjoyed herself when she went to the gym. In addition, she mentioned that she was able to write in her journal. Her goal for tomorrow is to get discharged and to "stay positive".   Hazle Coca S 04/14/2012, 9:39 PM
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
## 2012-04-14 NOTE — Progress Notes (Signed)
Reviewed the note and agree with the plan of treatment...Annissa Andreoni, MD 

## 2012-04-14 NOTE — Progress Notes (Signed)
[facility] MD Progress Note  04/14/2012 11:05 AM Lori [name]  MRN:  [id number] Subjective:   Despite wearing a diaper just made a huge mess on floor of her room. Says that she has a weak bladder. Also says her mother used to catheterize. Should probabaly check with guardian and find out when last seen by urology. Will recheck UA today. Remains irritable testy manipulative. System savvy and knows what to say and o to get what she wants. No evidence for psychosis.   Diagnosis:  Axis I: MDD (major depressive disorder), recurrent, severe, with psychosis  ADL's:  Impaired  Sleep: Good  Appetite:  Good  Suicidal Ideation:  Plan:  denies  Intent:  denies at this time Means:  has been on 1:1 Homicidal Ideation:  Denies  AEB (as evidenced by):  Psychiatric Specialty Exam: Review of Systems  Constitutional: Negative.   HENT: Negative.   Eyes: Negative.   Respiratory: Negative.   Cardiovascular: Negative.   Gastrointestinal: Negative.   Genitourinary: Positive for dysuria.  Musculoskeletal: Positive for myalgias.  Skin: Negative.   Neurological: Negative.   Endo/Heme/Allergies: Negative.   Psychiatric/Behavioral: Positive for depression.    Blood pressure 101/65, pulse 103, temperature 99.2 F (37.3 C), resp. rate 18.There is no height or weight on file to calculate BMI.  General Appearance: Fairly Groomed  Patent attorney::  Good  Speech:  Clear and Coherent  Volume:  Normal  Mood:  Angry, Anxious, Depressed and Irritable  Affect:  Congruent  Thought Process:  Goal Directed, Loose and Tangential  Orientation:  Full (Time, Place, and Person)  Thought Content:  Rumination  Suicidal Thoughts:  No  Homicidal Thoughts:  No  Memory : intact   Judgement:  Fair  Insight:  Shallow  Psychomotor Activity:  in wheelchair has leg braces   Concentration:  Good  Recall:  Good  Akathisia:  No  Handed:  Right  AIMS (if indicated):     Assets:  Communication Skills Resilience  Sleep:   Number of Hours: 6    Current Medications: Current Facility-Administered Medications  Medication Dose Route Frequency Provider Last Rate Last Dose  . acetaminophen (TYLENOL) tablet 650 mg  650 mg Oral Q6H PRN Verne Spurr, PA-C   650 mg at 04/11/12 2248  . alum & mag hydroxide-simeth (MAALOX/MYLANTA) 200-200-20 MG/5ML suspension 30 mL  30 mL Oral Q4H PRN Verne Spurr, PA-C   30 mL at 04/13/12 1804  . calamine lotion   Topical TID PRN Nelly Rout, MD      . cephALEXin (KEFLEX) capsule 500 mg  500 mg Oral TID Nelly Rout, MD   500 mg at 04/14/12 0843  . chlorproMAZINE (THORAZINE) tablet 100 mg  100 mg Oral TID PRN Nelly Rout, MD   100 mg at 04/13/12 1946  . citalopram (CELEXA) tablet 40 mg  40 mg Oral QHS Nelly Rout, MD   40 mg at 04/13/12 2124  . loratadine (CLARITIN) tablet 10 mg  10 mg Oral Daily Nelly Rout, MD   10 mg at 04/14/12 0843  . lurasidone (LATUDA) tablet 120 mg  120 mg Oral QHS Nelly Rout, MD   120 mg at 04/13/12 2124  . magnesium hydroxide (MILK OF MAGNESIA) suspension 30 mL  30 mL Oral Daily PRN Verne Spurr, PA-C      . oxyCODONE-acetaminophen (PERCOCET/ROXICET) 5-325 MG per tablet 1 tablet  1 tablet Oral Q6H PRN Nelly Rout, MD   1 tablet at 04/13/12 0902  . pantoprazole (PROTONIX) EC tablet 20 mg  20 mg Oral Daily Nelly Rout, MD   20 mg at 04/14/12 0844  . PHENobarbital (LUMINAL) tablet 64.8 mg  64.8 mg Oral QHS Nelly Rout, MD   64.8 mg at 04/13/12 2124  . polyethylene glycol (MIRALAX / GLYCOLAX) packet 17 g  17 g Oral Daily Nelly Rout, MD      . selenium sulfide (SELSUN) 2.5 % shampoo   Topical Daily PRN Nelly Rout, MD      . simvastatin (ZOCOR) tablet 20 mg  20 mg Oral q1800 Nelly Rout, MD   20 mg at 04/13/12 1725    Lab Results: No results found for this or any previous visit (from the past 48 hour(s)).  Physical Findings: AIMS: Facial and Oral Movements Muscles of Facial Expression: None, normal Lips and Perioral Area: None,  normal Jaw: None, normal Tongue: None, normal,Extremity Movements Upper (arms, wrists, hands, fingers): None, normal Lower (legs, knees, ankles, toes): None, normal, Trunk Movements Neck, shoulders, hips: None, normal, Overall Severity Severity of abnormal movements (highest score from questions above): None, normal Incapacitation due to abnormal movements: None, normal Patient's awareness of abnormal movements (rate only patient's report): No Awareness, Dental Status Current problems with teeth and/or dentures?: No Does patient usually wear dentures?: No  CIWA:    COWS:     Treatment Plan Summary: Daily contact with patient to assess and evaluate symptoms and progress in treatment Medication management help wih attaining highest level of performing own ADL's  Plan: Check UA today and see when last seen by Urology.  Medical Decision Making Problem Points:  Established problem, stable/improving (1), New problem, with additional work-up planned (4), Review of last therapy session (1) and Review of psycho-social stressors (1) Data Points:  Review and summation of old records (2) Review of medication regiment & side effects (2) Review of new medications or change in dosage (2)  I certify that inpatient services furnished can reasonably be expected to improve the patient's condition.   Halsey Hammen,[name] 04/14/2012, 11:05 AM

## 2012-04-14 NOTE — Progress Notes (Signed)
1:1 nursing note- Patient currently lying in bed asleep, eyes closed respirations even and unlabored, no distress noted. 1:1 continues, patient remains safe.

## 2012-04-15 DIAGNOSIS — F332 Major depressive disorder, recurrent severe without psychotic features: Secondary | ICD-10-CM

## 2012-04-15 NOTE — Progress Notes (Signed)
Psychoeducational Group Note  Date:  04/15/2012 Time:  1100  Group Topic/Focus:  Self Care:   The focus of this group is to help patients understand the importance of self-care in order to improve or restore emotional, physical, spiritual, interpersonal, and financial health.    Ruta Hinds [facility] 04/15/2012, 2:51 PM

## 2012-04-15 NOTE — Progress Notes (Signed)
[facility] LCSW Aftercare Discharge Planning Group Note  04/15/2012 9:29 AM  Participation Quality:  Did not attend.    [name], Lori Gravel 04/15/2012, 9:29 AM

## 2012-04-15 NOTE — Progress Notes (Signed)
Patient lying in bed awake, voiced no complaints, 1:1 continues, pt is safe.

## 2012-04-15 NOTE — Progress Notes (Signed)
Patient ID: Lori [name], female   DOB: 03-09-1971, 41 y.o.   MRN: [id number] She has repeatedly refused her AM medications.

## 2012-04-15 NOTE — Progress Notes (Signed)
[facility] MD Progress Note  04/15/2012 10:17 AM Lori [name]  MRN:  [id number] Subjective:  Patient lying in bed. Refusing to talk to this clinician. Has refused her medications and breakfast this morning. Not able to contract for safety. Diagnosis:   Axis I: Major Depression, Recurrent severe Axis II: Deferred Axis III:  Past Medical History  Diagnosis Date  . Spinal bifida, closed   . UTI (lower urinary tract infection)   . Seizures   . Cancer     colon  . Genital herpes   . Arthritis   . Hydrocephalus   . Psychoses    Axis IV: occupational problems and other psychosocial or environmental problems Axis V: 41-50 serious symptoms  ADL's:  Impaired  Sleep: Poor  Appetite:  Poor  Psychiatric Specialty Exam: Review of Systems  Constitutional: Negative.   HENT: Negative.   Eyes: Negative.   Respiratory: Negative.   Cardiovascular: Negative.   Genitourinary: Positive for dysuria.  Musculoskeletal: Positive for myalgias.  Skin: Negative.   Neurological: Negative.   Endo/Heme/Allergies: Negative.   Psychiatric/Behavioral: Positive for depression and suicidal ideas. The patient is nervous/anxious.     Blood pressure 101/65, pulse 103, temperature 99.2 F (37.3 C), resp. rate 18.There is no height or weight on file to calculate BMI.  General Appearance: Disheveled  Eye Contact::  Minimal  Speech:  Patient refusing to communicate.  Volume:  Normal  Mood:  Depressed and Dysphoric  Affect:  Constricted  Thought Process:  Circumstantial  Orientation:  Oriented to place and time  Thought Content:  WDL  Suicidal Thoughts:  Yes.  with intent/plan  Homicidal Thoughts:  No  Memory:  Immediate;   Fair Recent;   Fair Remote;   Fair  Judgement:  Impaired  Insight:  Lacking  Psychomotor Activity:  Decreased  Concentration:  Fair  Recall:  Fair  Akathisia:  No  Handed:  Right  AIMS (if indicated):     Assets:  Social Support  Sleep:  Number of Hours: 3.25    Current  Medications: Current Facility-Administered Medications  Medication Dose Route Frequency Provider Last Rate Last Dose  . acetaminophen (TYLENOL) tablet 650 mg  650 mg Oral Q6H PRN Verne Spurr, PA-C   650 mg at 04/11/12 2248  . alum & mag hydroxide-simeth (MAALOX/MYLANTA) 200-200-20 MG/5ML suspension 30 mL  30 mL Oral Q4H PRN Verne Spurr, PA-C   30 mL at 04/13/12 1804  . calamine lotion   Topical TID PRN Nelly Rout, MD      . cephALEXin (KEFLEX) capsule 500 mg  500 mg Oral TID Nelly Rout, MD   500 mg at 04/14/12 1417  . chlorproMAZINE (THORAZINE) tablet 100 mg  100 mg Oral TID PRN Nelly Rout, MD   100 mg at 04/13/12 1946  . citalopram (CELEXA) tablet 40 mg  40 mg Oral QHS Nelly Rout, MD   40 mg at 04/13/12 2124  . loratadine (CLARITIN) tablet 10 mg  10 mg Oral Daily Nelly Rout, MD   10 mg at 04/14/12 0843  . lurasidone (LATUDA) tablet 120 mg  120 mg Oral QHS Nelly Rout, MD   120 mg at 04/13/12 2124  . magnesium hydroxide (MILK OF MAGNESIA) suspension 30 mL  30 mL Oral Daily PRN Verne Spurr, PA-C      . oxyCODONE-acetaminophen (PERCOCET/ROXICET) 5-325 MG per tablet 1 tablet  1 tablet Oral Q6H PRN Nelly Rout, MD   1 tablet at 04/13/12 0902  . pantoprazole (PROTONIX) EC tablet 20 mg  20  mg Oral Daily Nelly Rout, MD   20 mg at 04/14/12 0844  . PHENobarbital (LUMINAL) tablet 64.8 mg  64.8 mg Oral QHS Nelly Rout, MD   64.8 mg at 04/13/12 2124  . polyethylene glycol (MIRALAX / GLYCOLAX) packet 17 g  17 g Oral Daily Nelly Rout, MD      . selenium sulfide (SELSUN) 2.5 % shampoo   Topical Daily PRN Nelly Rout, MD      . simvastatin (ZOCOR) tablet 20 mg  20 mg Oral q1800 Nelly Rout, MD   20 mg at 04/14/12 1816    Lab Results: No results found for this or any previous visit (from the past 48 hour(s)).  Physical Findings: AIMS: Facial and Oral Movements Muscles of Facial Expression: None, normal Lips and Perioral Area: None, normal Jaw: None, normal Tongue: None,  normal,Extremity Movements Upper (arms, wrists, hands, fingers): None, normal Lower (legs, knees, ankles, toes): None, normal, Trunk Movements Neck, shoulders, hips: None, normal, Overall Severity Severity of abnormal movements (highest score from questions above): None, normal Incapacitation due to abnormal movements: None, normal Patient's awareness of abnormal movements (rate only patient's report): No Awareness, Dental Status Current problems with teeth and/or dentures?: No Does patient usually wear dentures?: No  CIWA:    COWS:     Treatment Plan Summary: Daily contact with patient to assess and evaluate symptoms and progress in treatment Medication management  Plan: Encourage patient to take her medications and eat. Transfer patient to [address] hall for appropriate placement. Contact guardian to obtain input into patient`s care.  Medical Decision Making Problem Points:  Established problem, worsening (2), Review of last therapy session (1) and Review of psycho-social stressors (1) Data Points:  Review of medication regiment & side effects (2) Review of new medications or change in dosage (2)  I certify that inpatient services furnished can reasonably be expected to improve the patient's condition.   Una Yeomans 04/15/2012, 10:17 AM

## 2012-04-15 NOTE — Progress Notes (Signed)
[facility] LCSW Group Therapy  04/15/2012 4:23 PM  Type of Therapy:  Group Therapy  Participation Level:  Did Not Attend  [name], Lori Michelle 04/15/2012, 4:23 PM  

## 2012-04-15 NOTE — Progress Notes (Signed)
Psychoeducational Group Note  Date:  04/15/2012 Time:  2000  Group Topic/Focus:  Wrap-Up Group:   The focus of this group is to help patients review their daily goal of treatment and discuss progress on daily workbooks.  Participation Level:  Did Not Attend  Participation Quality:  N/A Did not attend  Affect:    Cognitive:    Insight:    Engagement in Group:    Additional Comments:  Patient remained in bed, awake, for the duration of wrap-up group this evening.  Arhan Mcmanamon, Newton Pigg 04/15/2012, 8:46 PM

## 2012-04-15 NOTE — Progress Notes (Signed)
Patient ID: Lori [name], female   DOB: [birth year], 41 y.o.   MRN: [id number] PER STATE REGULATIONS 482.30  THIS CHART WAS REVIEWED FOR MEDICAL NECESSITY WITH RESPECT TO THE PATIENT'S ADMISSION/ DURATION OF STAY.  NEXT REVIEW DATE: 04/18/2012 Willa Rough, RN, BSN CASE MANAGER

## 2012-04-15 NOTE — Progress Notes (Signed)
23:00 Nsg 1:1 note-D-Patient has been in bed during the shift this evening. Her mood has been labile. Patient was very hostile with Clinical research associate after dinner and refused scheduled medications. Patient was pleasant and took all her nighttime medications. She requested a percocet for headache.  A-Patient was receptive to emotional support at bedtime but mood remains unstable. The patient enjoys talking about her favorite things such as collecting toys.  R-1:1 observation renewed to ensure safety and for patient to have assistance with her ADL's. Patient will not tend to hygiene issues unless encouraged.

## 2012-04-15 NOTE — Clinical Social Work Note (Signed)
CSW spoke with Glee Arvin, Interior and spatial designer of [facility] [phone]).  Glee Arvin states that pt is able to return there, despite pt not wanting to return there.  Glee Arvin states that it was very hard to find placement for pt.  Glee Arvin states that pt told her before admission to the hospital that she would do anything she needed to do to get to [facility] to be with her girlfriend, who is currently hospitalized there.  CSW will keep [facility] updated on d/c plans, regarding if pt is returning there or not.

## 2012-04-15 NOTE — Progress Notes (Signed)
1:1 nursing note, late entry- Patient currently asleep, respirations even and unlabored, no distress noted, safety maintained and 1:1 continues.

## 2012-04-15 NOTE — Progress Notes (Signed)
1:1 Nursing note - Patient was assisted to bathroom by sitter and MHT informed writer that patient was incontinent and sitter was changing patients linen. Writer entered patients room and observed her on the commode wiping herself. Writer asked patient what happened and she angrily answered "what does it look like happened."  Once Clinical research associate observed patient was getting feces on her Interior and spatial designer asked that patient make sure she washed her hands and genitals area. Patient became angry and called writer a bitch and reported that she did not need my help.   Patient was very angry and cursing at Clinical research associate and staff, refusing assistance with perineal care. When staff did attempted to place pad on mattress patient was attempting to scratch and bite staff. Writer informed patient that since she was behaving this way we would not be placing the pad on her mattress  Patient was left in bed without washing hands nor cleaning self after urinating and defecating in commode. Patient refused to remain covered or wear a gown so she laid in the nude on her bed uncovered. Patient remains safe, 1:1 continues.

## 2012-04-16 ENCOUNTER — Encounter (HOSPITAL_COMMUNITY): Payer: Self-pay | Admitting: *Deleted

## 2012-04-16 MED ORDER — LURASIDONE HCL 80 MG PO TABS
80.0000 mg | ORAL_TABLET | Freq: Every day | ORAL | Status: DC
  Administered 2012-04-16: 80 mg via ORAL
  Administered 2012-04-17: 160 mg via ORAL
  Administered 2012-04-18 – 2012-05-01 (×15): 80 mg via ORAL
  Filled 2012-04-16 (×19): qty 1

## 2012-04-16 NOTE — Progress Notes (Signed)
1:1 nursing note: D:) Patient in bed but not asleep.  Patient states she needs carmex for a fever blister. Patient hyper verbal but cooperative.  Patient still will not complete her urinary sample.  Patient denies SI/HI and AVH.  Patient agreed to check her depends more frequently and notify staff so it can be changed.   A: Patient remains on 1:1 observation fer safety. R:) Patient remains safe.

## 2012-04-16 NOTE — Progress Notes (Signed)
Psychoeducational Group Note  Date:  04/16/2012 Time:  2030   Group Topic/Focus:  Wrap-Up Group:   The focus of this group is to help patients review their daily goal of treatment and discuss progress on daily workbooks.  Participation Level:  None  Participation Quality:  Intrusive  Affect:  Excited  Cognitive:  Alert  Insight:  Distracting  Engagement in Group:  Distracting  Additional Comments:  Patient left group when redirected and asked to allow those that are sharing to finish sharing with the group.  Dahlia Client Lyn 04/16/2012, 9:58 PM

## 2012-04-16 NOTE — Progress Notes (Signed)
D: Patient resting in bed with eyes closed.  Respirations even and unlabored.  Patient appears to be in no apparent distress. A: Staff to monitor Q 15 mins for safety.   R:Patient remains safe on the unit.  

## 2012-04-16 NOTE — Progress Notes (Signed)
D:  Patient continues on one to one status for safety.  Has been up and down the halls in her wheelchair.  Crutches were also retrieved from the search room and given to her.   A:  One to one maintained.  Crutches provided to patient per her request after checking with the [facility] that they were allowed.   R:  Continues to be cooperative.  Remains safe on the unit.
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
## 2012-04-16 NOTE — Progress Notes (Signed)
D:  Patient continues on one to one for safety.  Expresses no concerns at this time.  Denies depression or hopelessness.  Also denies suicidal thoughts.   A:  One to one maintained.  Assessed needs. R:  Pleasant and cooperative at this time.  Remains safe on the unit.

## 2012-04-16 NOTE — Progress Notes (Signed)
[facility] MD Progress Note  04/16/2012 10:05 AM Lori [name]  MRN:  [id number]  Subjective:  Patient up and about in her wheel chair. Continues to be non compliant with her medications intermittently. Not able to engage in conversation.Tried to contact her care giver cheryl Earlene Plater, mail box full.  Diagnosis:   Axis I: Depressive Disorder NOS Axis II: Deferred Axis III:  Past Medical History  Diagnosis Date  . Spinal bifida, closed   . UTI (lower urinary tract infection)   . Seizures   . Cancer     colon  . Genital herpes   . Arthritis   . Hydrocephalus   . Psychoses    Axis IV: other psychosocial or environmental problems Axis V: 41-50 serious symptoms  ADL's:  Impaired  Sleep: Fair  Appetite:  Poor    Psychiatric Specialty Exam: Review of Systems  Constitutional: Negative.   HENT: Negative.   Eyes: Negative.   Respiratory: Negative.   Cardiovascular: Negative.   Gastrointestinal: Negative.   Genitourinary: Positive for dysuria.  Musculoskeletal: Negative.   Skin: Negative.   Neurological: Negative.   Endo/Heme/Allergies: Negative.   Psychiatric/Behavioral: Positive for depression and suicidal ideas.    Blood pressure 104/70, pulse 90, temperature 97.9 F (36.6 C), resp. rate 18.There is no height or weight on file to calculate BMI.  General Appearance: Disheveled  Eye Contact::  Minimal  Speech:  Slow  Volume:  Normal  Mood:  Anxious, Dysphoric and Irritable  Affect:  Constricted and Flat  Thought Process:  Circumstantial and Linear  Orientation:  Full (Time, Place, and Person)  Thought Content:  Rumination  Suicidal Thoughts:  Yes.  without intent/plan  Homicidal Thoughts:  No  Memory:  Immediate;   Fair Recent;   Fair Remote;   Fair  Judgement:  Impaired  Insight:  Lacking  Psychomotor Activity:  Decreased  Concentration:  Fair  Recall:  Fair  Akathisia:  No  Handed:  Right  AIMS (if indicated):     Assets:  Social Support  Sleep:  Number of  Hours: 6.75    Current Medications: Current Facility-Administered Medications  Medication Dose Route Frequency Provider Last Rate Last Dose  . acetaminophen (TYLENOL) tablet 650 mg  650 mg Oral Q6H PRN Verne Spurr, PA-C   650 mg at 04/11/12 2248  . alum & mag hydroxide-simeth (MAALOX/MYLANTA) 200-200-20 MG/5ML suspension 30 mL  30 mL Oral Q4H PRN Verne Spurr, PA-C   30 mL at 04/13/12 1804  . calamine lotion   Topical TID PRN Nelly Rout, MD      . cephALEXin (KEFLEX) capsule 500 mg  500 mg Oral TID Nelly Rout, MD   500 mg at 04/16/12 0759  . chlorproMAZINE (THORAZINE) tablet 100 mg  100 mg Oral TID PRN Nelly Rout, MD   100 mg at 04/13/12 1946  . citalopram (CELEXA) tablet 40 mg  40 mg Oral QHS Nelly Rout, MD   40 mg at 04/15/12 2141  . loratadine (CLARITIN) tablet 10 mg  10 mg Oral Daily Nelly Rout, MD   10 mg at 04/16/12 0759  . lurasidone (LATUDA) tablet 120 mg  120 mg Oral QHS Nelly Rout, MD   120 mg at 04/15/12 2141  . magnesium hydroxide (MILK OF MAGNESIA) suspension 30 mL  30 mL Oral Daily PRN Verne Spurr, PA-C      . oxyCODONE-acetaminophen (PERCOCET/ROXICET) 5-325 MG per tablet 1 tablet  1 tablet Oral Q6H PRN Nelly Rout, MD   1 tablet at 04/15/12 2151  .  pantoprazole (PROTONIX) EC tablet 20 mg  20 mg Oral Daily Nelly Rout, MD   20 mg at 04/16/12 0759  . PHENobarbital (LUMINAL) tablet 64.8 mg  64.8 mg Oral QHS Nelly Rout, MD   64.8 mg at 04/15/12 2141  . polyethylene glycol (MIRALAX / GLYCOLAX) packet 17 g  17 g Oral Daily Nelly Rout, MD      . selenium sulfide (SELSUN) 2.5 % shampoo   Topical Daily PRN Nelly Rout, MD      . simvastatin (ZOCOR) tablet 20 mg  20 mg Oral q1800 Nelly Rout, MD   20 mg at 04/14/12 1816    Lab Results: No results found for this or any previous visit (from the past 48 hour(s)).  Physical Findings: AIMS: Facial and Oral Movements Muscles of Facial Expression: None, normal Lips and Perioral Area: None, normal Jaw:  None, normal Tongue: None, normal,Extremity Movements Upper (arms, wrists, hands, fingers): None, normal Lower (legs, knees, ankles, toes): None, normal, Trunk Movements Neck, shoulders, hips: None, normal, Overall Severity Severity of abnormal movements (highest score from questions above): None, normal Incapacitation due to abnormal movements: None, normal Patient's awareness of abnormal movements (rate only patient's report): No Awareness, Dental Status Current problems with teeth and/or dentures?: No Does patient usually wear dentures?: No  CIWA:    COWS:     Treatment Plan Summary: Daily contact with patient to assess and evaluate symptoms and progress in treatment Medication management  Plan: Decrease Latuda dosage, could be causing patient to be agitated. Encourage patient to cooperate with her ADL`s.  Medical Decision Making Problem Points:  Established problem, worsening (2), Review of last therapy session (1) and Review of psycho-social stressors (1) Data Points:  Review of medication regiment & side effects (2) Review of new medications or change in dosage (2)  I certify that inpatient services furnished can reasonably be expected to improve the patient's condition.   Phinneas Shakoor 04/16/2012, 10:05 AM

## 2012-04-16 NOTE — Tx Team (Signed)
Initial Interdisciplinary Treatment Plan  PATIENT STRENGTHS: (choose at least two) Active sense of humor Average or above average intelligence Communication skills Financial means General fund of knowledge Religious Affiliation  PATIENT STRESSORS: Health problems   PROBLEM LIST: Problem List/Patient Goals Date to be addressed Date deferred Reason deferred Estimated date of resolution  Depression 11 Apr 2012                                                      DISCHARGE CRITERIA:  Ability to meet basic life and health needs Adequate post-discharge living arrangements Improved stabilization in mood, thinking, and/or behavior Medical problems require only outpatient monitoring Motivation to continue treatment in a less acute level of care Need for constant or close observation no longer present  PRELIMINARY DISCHARGE PLAN: Outpatient therapy Placement in alternative living arrangements  PATIENT/FAMIILY INVOLVEMENT: This treatment plan has been presented to and reviewed with the patient, Verita Kuroda, and/or family member.  The patient and family have been given the opportunity to ask questions and make suggestions.  Izola Price Mae 04/16/2012, 4:24 PM

## 2012-04-16 NOTE — Progress Notes (Signed)
D:  Patient up in wheelchair in room.  Talkative, joking.  Declines any needs at this time.   A:  One to one maintained for safety.   R:  Pleasant and cooperative at this time.  Patient remains safe at this time.

## 2012-04-16 NOTE — Progress Notes (Signed)
D: Patient in her room on approach.  Patient talkative but appears grandiose an delusional at times.  Patient states she had a baby at the age of 19.  Patient states he had a good day.  Patient states is going to get married soon and her son could not come to her wedding unless her son bought an 8500 dollar ring.  Patient denies SI/HI and denies AVH.   A: Staff to monitor Q 15 mins for safety.  Encouragement and support offered.  Scheduled medication administered per orders.  Patient given percocet prn for knee pain. R: Patient remains safe on the unit.  Patient attended group tonight but left group early because she got upset with another patient.

## 2012-04-17 NOTE — Progress Notes (Signed)
[name] Progress Note  04/17/2012 10:17 AM Lori [name]  MRN:  [id number]  Subjective:  Dressed and up in her wheelchair. Braces are off. Mood is bright cheerful and she initiates conversation. 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 Plans to move to [place] to do this and needs $64,000 to do this. Says she only lacks $3,000.  Repeat UA has not been done  yet. Diagnosis:   Axis I: Depressive Disorder NOS Axis II: Deferred Axis III:  Past Medical History  Diagnosis Date  . Spinal bifida, closed   . UTI (lower urinary tract infection)   . Seizures   . Cancer     colon  . Genital herpes   . Arthritis   . Hydrocephalus   . Psychoses    Axis IV: other psychosocial or environmental problems Axis V: 41-50 serious symptoms  ADL's:  Impaired  Sleep: Fair  Appetite:  Poor    Psychiatric Specialty Exam: Review of Systems  Constitutional: Negative.   HENT: Negative.   Eyes: Negative.   Respiratory: Negative.   Cardiovascular: Negative.   Gastrointestinal: Negative.   Genitourinary: Positive for dysuria.  Musculoskeletal: Negative.   Skin: Negative.   Neurological: Negative.   Endo/Heme/Allergies: Negative.   Psychiatric/Behavioral: Positive for depression and suicidal ideas.    Blood pressure 104/70, pulse 90, temperature 97.9 F (36.6 C), resp. rate 18.There is no height or weight on file to calculate BMI.  General Appearance: clean and appropriately dressed   Eye Contact:  Minimal  Speech:  Slow  Volume:  Normal  Mood:  Bright cheerful at the moment but she is quite changeable   Affect:Bright and cheerful   Thought Process:  Circumstantial and Linear see above   Orientation:  Full (Time, Place, and Person)  Thought Content:  Rumination  Suicidal Thoughts:  Yes.  without intent/plan  Denies today   Homicidal Thoughts:  No  Memory:  Immediate;   Fair Recent;   Fair Remote;   Fair   Judgement:  Impaired  Insight:  Lacking  Psychomotor Activity:  Decreased  Concentration:  Fair  Recall:  Fair  Akathisia:  No  Handed:  Right  AIMS (if indicated):     Assets:  Social Support  Sleep:  Number of Hours: 6.75    Current Medications: Current Facility-Administered Medications  Medication Dose Route Frequency Provider Last Rate Last Dose  . acetaminophen (TYLENOL) tablet 650 mg  650 mg Oral Q6H PRN Verne Spurr, PA-C   650 mg at 04/11/12 2248  . alum & mag hydroxide-simeth (MAALOX/MYLANTA) 200-200-20 MG/5ML suspension 30 mL  30 mL Oral Q4H PRN Verne Spurr, PA-C   30 mL at 04/13/12 1804  . calamine lotion   Topical TID PRN Nelly Rout, MD      . cephALEXin (KEFLEX) capsule 500 mg  500 mg Oral TID Nelly Rout, MD   500 mg at 04/17/12 0848  . chlorproMAZINE (THORAZINE) tablet 100 mg  100 mg Oral TID PRN Nelly Rout, MD   100 mg at 04/13/12 1946  . citalopram (CELEXA) tablet 40 mg  40 mg Oral QHS Nelly Rout, MD   40 mg at 04/16/12 2131  . loratadine (CLARITIN) tablet 10 mg  10 mg Oral Daily Nelly Rout, MD   10 mg at 04/17/12 0849  . lurasidone (LATUDA) tablet 80 mg  80 mg Oral QHS Himabindu Ravi, MD   80 mg at 04/16/12 2131  .  magnesium hydroxide (MILK OF MAGNESIA) suspension 30 mL  30 mL Oral Daily PRN Verne Spurr, PA-C      . oxyCODONE-acetaminophen (PERCOCET/ROXICET) 5-325 MG per tablet 1 tablet  1 tablet Oral Q6H PRN Nelly Rout, MD   1 tablet at 04/16/12 2002  . pantoprazole (PROTONIX) EC tablet 20 mg  20 mg Oral Daily Nelly Rout, MD   20 mg at 04/17/12 0849  . PHENobarbital (LUMINAL) tablet 64.8 mg  64.8 mg Oral QHS Nelly Rout, MD   64.8 mg at 04/16/12 2131  . polyethylene glycol (MIRALAX / GLYCOLAX) packet 17 g  17 g Oral Daily Nelly Rout, MD      . selenium sulfide (SELSUN) 2.5 % shampoo   Topical Daily PRN Nelly Rout, MD      . simvastatin (ZOCOR) tablet 20 mg  20 mg Oral q1800 Nelly Rout, MD   20 mg at 04/16/12 1830    Lab Results:  No results found for this or any previous visit (from the past 48 hour(s)).  Physical Findings: AIMS: Facial and Oral Movements Muscles of Facial Expression: None, normal Lips and Perioral Area: None, normal Jaw: None, normal Tongue: None, normal,Extremity Movements Upper (arms, wrists, hands, fingers): None, normal Lower (legs, knees, ankles, toes): None, normal, Trunk Movements Neck, shoulders, hips: None, normal, Overall Severity Severity of abnormal movements (highest score from questions above): None, normal Incapacitation due to abnormal movements: None, normal Patient's awareness of abnormal movements (rate only patient's report): No Awareness, Dental Status Current problems with teeth and/or dentures?: No Does patient usually wear dentures?: No  CIWA:    COWS:     Treatment Plan Summary: Daily contact with patient to assess and evaluate symptoms and progress in treatment Medication management  Plan: No 400 beds  Encourage patient to cooperate with her ADL`s.  Medical Decision Making Problem Points:  Established problem, worsening (2), Review of last therapy session (1) and Review of psycho-social stressors (1) Data Points:  Review of medication regiment & side effects (2) Review of new medications or change in dosage (2)  I certify that inpatient services furnished can reasonably be expected to improve the patient's condition.   Tomie Elko,[name] RPA-C CAQ-Psych 04/17/2012, 10:17 AM

## 2012-04-17 NOTE — Progress Notes (Signed)
D: Patient appropriate and cooperative with staff and peers. Patient's mood/affect is labile. Declined self inventory sheet.  A: Support and encouragement provided to patient. Scheduled medications administered per MD orders. Monitor Q15 minute checks for safety.  R: Patient receptive. Denies SI/HI. Patient remains safe on the unit.

## 2012-04-17 NOTE — Progress Notes (Signed)
1:1 (3:00 pm)  D: Patient is sitting in wheelchair in dayroom watching t.v.  A: Monitor 1:1 observation for safety.  R: Patient remains safe.

## 2012-04-17 NOTE — Progress Notes (Addendum)
1:1 Note (11:00 am)  D: Patient appropriate and cooperative with staff and peers. Patient is sitting in wheelchair in dayroom. A: Maintain 1:1 observation for safety. R: Patient remains safe on the unit.

## 2012-04-18 DIAGNOSIS — F329 Major depressive disorder, single episode, unspecified: Secondary | ICD-10-CM

## 2012-04-18 MED ORDER — ENSURE COMPLETE PO LIQD
237.0000 mL | Freq: Three times a day (TID) | ORAL | Status: DC
  Administered 2012-04-21 – 2012-04-27 (×18): 237 mL via ORAL
  Filled 2012-04-18 (×3): qty 237

## 2012-04-18 NOTE — Clinical Social Work Note (Signed)
[facility] LCSW Group Therapy       Living a Balanced Life  1:15-2:30         04/18/2012 2:32 PM    Type of Therapy:  Group Therapy  Participation Level:  Appropriate  Participation Quality:  Appropriate  Affect:  Appropriate  Cognitive:  Attentive Appropriate  Insight:  Engaged  Engagement in Therapy:  Engaged  Modes of Intervention:  Discussion Exploration Problem-Solving Supportive  Summary of Progress/Problems:  Patient shared a balanced life for her would be having hope and being happy instead of feeling hopeless all the time.  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  Wynn Banker 04/18/2012 2:32 PM

## 2012-04-18 NOTE — Progress Notes (Signed)
1:1 Note  Patient currently in dayroom interacting with other patients. Patient currently denies any concerns or needs at this time. Will continue to monitor patient for safety.

## 2012-04-18 NOTE — Progress Notes (Signed)
Patient ID: Lori [name], female   DOB: 09/04/1970, 41 y.o.   MRN: [id number] D: Pt. Smiled and glared and said nothing. A: Pt. Continues to have incongruent moods/affects and is often labile in her reactions to people and works said around her: all questions made to her about her condition and her meds she seems to consider either a confrontation or and implied threat of some kind.  Pt. Continues to be disheveled most of the time and makes little to no effort to cleanse herself and says she expects staff to do this for her.  She is often demanding of small things and becomes easily irritated when she is told she must either wait or make some change or is questioned about her answers: i.e. when she describes her chronic knee pain as a 10/10, then turns to the 1:1 observer and continues her interrupted conversation, without a pause.  Pt. Seen in the bed at this time. R: Will continue to monitor for changes.  1:1 observations continue for safety.   19:30l--D: Pt.was incontinent of urine and stool in bed and made no effort to alert staff to her need to go to the bathroom.  Pt. Was finally assisted out of bed after the smell was noted and her bed was cleansed and changed.  Pt. Was irritable at med time and refused a med. AC was alerted and gave Pt. Meds.  Pt. Continues to converse with her observer in long conversations and laughs and talks loudly at times. A: Pt. continues on 1:1 observations for safety and her unpredictable behavior.

## 2012-04-18 NOTE — Progress Notes (Signed)
Patient ID: Lori [name], female   DOB: July 01, 1970, 41 y.o.   MRN: [id number]  PER STATE REGULATIONS 482.30  THIS CHART WAS REVIEWED FOR MEDICAL NECESSITY WITH RESPECT TO THE PATIENT'S ADMISSION/ DURATION OF STAY.  NEXT REVIEW DATE: 04/21/2012  Willa Rough, RN, BSN CASE MANAGER

## 2012-04-18 NOTE — Progress Notes (Signed)
Patient ID: Lori [name], female   DOB: 1970-10-28, 41 y.o.   MRN: [id number] 1:1 note  D: Patient awake, with c/o knee pain. Pt pleasant and cooperative. A: Pt remains on 1:1 observation. R: Patient compliant with medications, denies SI; no inappropriate behaviors noted.

## 2012-04-18 NOTE — Progress Notes (Signed)
Patient ID: Lori [name], female   DOB: Feb 03, 1971, 41 y.o.   MRN: [id number]  1:1 note-  D: Patient currently sitting in dayroom with sitter. Pt endorses depression with dull flat affect. Pt cooperative.A: Pt remains on 1:1 observation for safety. Encouraged patient to attend group session. R: No inappropriate behaviors noted at this time.

## 2012-04-18 NOTE — Progress Notes (Signed)
1:1 Note   Patient currently in dayroom interacting with other patients. Patient currently denies any needs or concerns. The patient remains safe and on 1:1 with sitter.

## 2012-04-18 NOTE — Progress Notes (Signed)
1:1 Note  Patient currently in bed resting with eyes closed. No concerns or needs at this time. Will continue to monitor patient for safety.

## 2012-04-18 NOTE — Progress Notes (Signed)
D: Patient resting in bed with eyes closed.  Respirations even and unlabored.  Patient appears to be in no apparent distress. A: Staff to monitor Q 15 mins for safety.   R:Patient remains safe on the unit.  

## 2012-04-18 NOTE — Progress Notes (Signed)
Nutrition Brief Note  Patient identified on the Malnutrition Screening Tool (MST) Report  Wt Readings from Last 10 Encounters:  04/09/12 91 lb (41.277 kg)  01/17/12 116 lb (52.617 kg)  12/21/11 116 lb (52.617 kg)   Pt meets criteria for wnl based on current BMI using last known wt and ht of 4'9" and 41.2 kg.   Current diet order is Regular, however pt's appetite is poor. Labs and medications reviewed.   Pt with wt loss of ~25 lbs in 4 months which is clinically severe.  Pt with poor intake, delusional, not appropriate for education.  Will order Ensure Complete TID for pt. If additional nutrition issues arise or pt continues with poor intake, please consult RD.   Loyce Dys, MS RD LDN Clinical Inpatient Dietitian Pager: [phone] Weekend/After hours pager: [phone]

## 2012-04-18 NOTE — Clinical Social Work Note (Signed)
Writer spoke with patient's guardian, [name], after patient stated she wants to go to another group home.  [name] stated patient has been in multiple group homes and has the most success where she is at [facility].  She stated she plans to come visit with patient and see what is going on.  She shared patient is extremely difficult to place and does not want to immediately move on her request.  She shared patient has not been receiving mental health care at [facility] but has been receiving meds through her PCP.  She stated patient's Medicaid is through [facility] and not contracted for services in our area.  She shared the coordinator is working to get services transferred to [facility] but that has not taken place as of yet.  [name] to call back either on Friday or Monday to let us know whether or not a new placement should be obtained.  [name] was asked to contact Dahlia Client if she does not call writer back on Friday.

## 2012-04-18 NOTE — Clinical Social Work Note (Signed)
[facility] LCSW Aftercare Discharge Planning Group Note       8:30-9:30 AM  12/26/20132:20 PM  Participation Quality:  Did not attend  Affect:    Cognitive:    Insight:    Engagement in Group:   Modes of Intervention:    Summary of Progress/Problems:   Lori [name] 04/18/2012 2:20 PM

## 2012-04-18 NOTE — Progress Notes (Signed)
D: Patient denies SI/HI and auditory and visual hallucinations. The patient has a depressed mood and affect. The patient is also irritable at times, stating that she "doesn't want a female nurse" and that she "prefers female staff." The patient is incontinent of stool and urine and will only occasionally alert staff members to her needs. The patient requires at least two linen changes a shift. The patient is delusional, telling RN that she's been "married eight times" and that she knows "much more than everyone else."   A: Patient given emotional support from RN. Patient encouraged to come to staff with concerns and/or questions. Patient's medication routine continued. Patient's orders and plan of care reviewed.  R: Patient remains safe. Will continue to monitor patient q15 minutes for safety.

## 2012-04-18 NOTE — Progress Notes (Signed)
[facility] MD Progress Note  04/18/2012 10:02 AM Lori [name]  MRN:  [id number] Subjective:  Patient very irritable, states she has not slept well. Denies having suicidal thoughts or AH/VH. She has tolerated the decrease in [place] without any problems, more alert.  Diagnosis:   Axis I: Depressive Disorder NOS Axis II: Deferred Axis III:  Past Medical History  Diagnosis Date  . Spinal bifida, closed   . UTI (lower urinary tract infection)   . Seizures   . Cancer     colon  . Genital herpes   . Arthritis   . Hydrocephalus   . Psychoses    Axis IV: other psychosocial or environmental problems Axis V: 51-60 moderate symptoms  ADL's:  Intact  Sleep: Poor  Appetite:  Poor   Psychiatric Specialty Exam: Review of Systems  Constitutional: Negative.   HENT: Negative.   Eyes: Negative.   Respiratory: Negative.   Cardiovascular: Negative.   Gastrointestinal: Negative.   Genitourinary: Negative.   Musculoskeletal: Positive for myalgias.  Skin: Negative.   Neurological: Negative.   Endo/Heme/Allergies: Negative.   Psychiatric/Behavioral: Positive for depression. The patient is nervous/anxious.     Blood pressure 117/60, pulse 67, temperature 98 F (36.7 C), temperature source Oral, resp. rate 16.There is no height or weight on file to calculate BMI.  General Appearance: Disheveled  Eye Solicitor::  Fair  Speech:  Clear and Coherent  Volume:  Increased  Mood:  Dysphoric and Irritable  Affect:  Labile  Thought Process:  Circumstantial  Orientation:  Full (Time, Place, and Person)  Thought Content:  Rumination  Suicidal Thoughts:  No  Homicidal Thoughts:  No  Memory:  Immediate;   Fair Recent;   Fair Remote;   Fair  Judgement:  Impaired  Insight:  Lacking  Psychomotor Activity:  Decreased  Concentration:  Fair  Recall:  Fair  Akathisia:  No  Handed:  Right  AIMS (if indicated):     Assets:  Housing  Sleep:  Number of Hours: 3.5    Current Medications: Current  Facility-Administered Medications  Medication Dose Route Frequency Provider Last Rate Last Dose  . acetaminophen (TYLENOL) tablet 650 mg  650 mg Oral Q6H PRN Verne Spurr, PA-C   650 mg at 04/11/12 2248  . alum & mag hydroxide-simeth (MAALOX/MYLANTA) 200-200-20 MG/5ML suspension 30 mL  30 mL Oral Q4H PRN Verne Spurr, PA-C   30 mL at 04/13/12 1804  . calamine lotion   Topical TID PRN Nelly Rout, MD      . cephALEXin (KEFLEX) capsule 500 mg  500 mg Oral TID Nelly Rout, MD   500 mg at 04/18/12 0927  . chlorproMAZINE (THORAZINE) tablet 100 mg  100 mg Oral TID PRN Nelly Rout, MD   100 mg at 04/17/12 1946  . citalopram (CELEXA) tablet 40 mg  40 mg Oral QHS Nelly Rout, MD   40 mg at 04/17/12 2239  . loratadine (CLARITIN) tablet 10 mg  10 mg Oral Daily Nelly Rout, MD   10 mg at 04/18/12 0930  . lurasidone (LATUDA) tablet 80 mg  80 mg Oral QHS Jo-Ann Johanning, MD   160 mg at 04/17/12 2240  . magnesium hydroxide (MILK OF MAGNESIA) suspension 30 mL  30 mL Oral Daily PRN Verne Spurr, PA-C      . oxyCODONE-acetaminophen (PERCOCET/ROXICET) 5-325 MG per tablet 1 tablet  1 tablet Oral Q6H PRN Nelly Rout, MD   1 tablet at 04/17/12 1946  . pantoprazole (PROTONIX) EC tablet 20 mg  20 mg  Oral Daily Nelly Rout, MD   20 mg at 04/18/12 0926  . PHENobarbital (LUMINAL) tablet 64.8 mg  64.8 mg Oral QHS Nelly Rout, MD   64.8 mg at 04/17/12 2241  . polyethylene glycol (MIRALAX / GLYCOLAX) packet 17 g  17 g Oral Daily Nelly Rout, MD      . selenium sulfide (SELSUN) 2.5 % shampoo   Topical Daily PRN Nelly Rout, MD      . simvastatin (ZOCOR) tablet 20 mg  20 mg Oral q1800 Nelly Rout, MD   20 mg at 04/16/12 1830    Lab Results: No results found for this or any previous visit (from the past 48 hour(s)).  Physical Findings: AIMS: Facial and Oral Movements Muscles of Facial Expression: None, normal Lips and Perioral Area: None, normal Jaw: None, normal Tongue: None, normal,Extremity  Movements Upper (arms, wrists, hands, fingers): None, normal Lower (legs, knees, ankles, toes): None, normal, Trunk Movements Neck, shoulders, hips: None, normal, Overall Severity Severity of abnormal movements (highest score from questions above): None, normal Incapacitation due to abnormal movements: None, normal Patient's awareness of abnormal movements (rate only patient's report): No Awareness, Dental Status Current problems with teeth and/or dentures?: No Does patient usually wear dentures?: No  CIWA:    COWS:     Treatment Plan Summary: Daily contact with patient to assess and evaluate symptoms and progress in treatment Medication management  Plan: Continue current plan of care. Try to contact patiebnt`s guardian again, plan for discharge once stabilized. Medical Decision Making Problem Points:  Established problem, stable/improving (1), Review of last therapy session (1) and Review of psycho-social stressors (1) Data Points:  Review of medication regiment & side effects (2)  I certify that inpatient services furnished can reasonably be expected to improve the patient's condition.   Lori [name] 04/18/2012, 10:02 AM

## 2012-04-19 NOTE — Progress Notes (Signed)
Pt resting in bed with eyes closed. Respirations even and unlabored. Pt remains on 1:1 observation. No complaints. Will continue to monitor pt. 

## 2012-04-19 NOTE — Tx Team (Signed)
Interdisciplinary Treatment Plan Update (Adult)  Date:  04/19/2012  Time Reviewed:  10:07 AM   Progress in Treatment: Attending groups:   Yes   Participating in groups:  Yes Taking medication as prescribed:  Yes Tolerating medication:  Yes Family/Significant othe contact made: Contact made with guardian. Patient understands diagnosis:  Yes Discussing patient identified problems/goals with staff: Yes Medical problems stabilized or resolved: Yes Denies suicidal/homicidal ideation:Yes Issues/concerns per patient self-inventory:  Other:   New problem(s) identified:  Reason for Continuation of Hospitalization: Anxiety Depression Medication stabilization Suicidal ideation  Interventions implemented related to continuation of hospitalization:  Medication Management; safety checks q 15 mins  Additional comments:  Estimated length of stay:  2-3 days  Discharge Plan:  Home with outpatient follow up  New goal(s):  Review of initial/current patient goals per problem list:    1.  Goal(s): Eliminate SI/other thoughts of self harm   Met:  Yes  Target date: d/c  As evidenced by: Patient no longer endorsing SI/HI or other thoughts of self harm.    2.  Goal (s):Reduce depression/anxiety (rated at six and ten)  Met: Yes  Target date: d/c  As evidenced by: Patient will  rating symptoms at four or below    3.  Goal(s):.stabilize on meds   Met:  No  Target date: d/c  As evidenced by: Patient will report being stabilized on medications - less symptomatic    4.  Goal(s): Refer for outpatient follow up   Met:  No  Target date: d/c  As evidenced by: Follow up appointment to be scheduled as discharge date/location known    Attendees: Patient:   04/19/2012 10:07 AM  Physican:  Patrick North, MD 04/19/2012 10:07 AM  Nursing:  Berneice Heinrich, RN 04/19/2012 10:07 AM   Nursing:    04/19/2012 10:07 AM   Clinical Social Worker:  Juline Patch, LCSW 04/19/2012 10:07  AM   Other: Tera Helper, PHM-NP 04/19/2012 10:07 AM   Other:  Patton Salles, Clinical Social Worker, LCSW  04/19/2012 10:07 AM Other:        04/19/2012 10:07 AM
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
## 2012-04-19 NOTE — Progress Notes (Signed)
1:1 Note  Patient currently in hall talking to RN. The patient states that she is irritated because "of all the hospital's rules." Patient states that she would like to "be with 'her' girlfriend" and that she would like to "adopt children and be happy." The patient notes no important concerns or questions at this time. Will continue to monitor patient for safety.

## 2012-04-19 NOTE — Progress Notes (Signed)
1:1 Note  Patient currently in dining room eating with other patients. Will continue to monitor patient for safety.
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
## 2012-04-19 NOTE — Progress Notes (Signed)
1:1 Note  Patient currently pacing up and down hallway. Patient states that she is "just angry about certain things." The patient refused to elaborate on that statement. The patient was given emotional support and encouragement from RN and MHT. Patient currently denies any needs or concerns at this time. Will continue to monitor patient for safety.

## 2012-04-19 NOTE — Progress Notes (Signed)
[facility] LCSW Group Therapy  04/19/2012 6:46 AM  Type of Therapy:  Group Therapy Summary of Progress/Problems: Patient did not attend group. Patient left group in the beginning saying she did not want to hear any stories of suicide, substance abuse, or any other type of tragedies.  Patton Salles 04/19/2012, 6:46 AM
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
## 2012-04-19 NOTE — Progress Notes (Signed)
[facility] MD Progress Note  04/19/2012 10:36 AM Lori [name]  MRN:  [id number] Subjective:  Patient seen this morning, ambulating in the hallways in her wheel chair. Reports her mood is better. Noted to be very irritable to staff.  Diagnosis:   Axis I: Depressive Disorder NOS Axis II: Deferred Axis III:  Past Medical History  Diagnosis Date  . Spinal bifida, closed   . UTI (lower urinary tract infection)   . Seizures   . Cancer     colon  . Genital herpes   . Arthritis   . Hydrocephalus   . Psychoses    Axis IV: housing problems Axis V: 51-60 moderate symptoms  ADL's:  Impaired  Sleep: Fair  Appetite:  Fair   Psychiatric Specialty Exam: Review of Systems  Constitutional: Negative.   HENT: Negative.   Eyes: Negative.   Respiratory: Negative.   Cardiovascular: Negative.   Gastrointestinal: Negative.   Genitourinary: Negative.   Musculoskeletal: Positive for myalgias.  Skin: Negative.   Neurological: Negative.   Endo/Heme/Allergies: Negative.   Psychiatric/Behavioral: Positive for depression.    Blood pressure 113/75, pulse 98, temperature 98.9 F (37.2 C), temperature source Oral, resp. rate 18.There is no height or weight on file to calculate BMI.  General Appearance: Disheveled  Eye Solicitor::  Fair  Speech:  Clear and Coherent  Volume:  Increased  Mood:  Anxious and Dysphoric  Affect:  Labile  Thought Process:  Coherent  Orientation:  Full (Time, Place, and Person)  Thought Content:  WDL  Suicidal Thoughts:  No  Homicidal Thoughts:  No  Memory:  Immediate;   Fair Recent;   Fair Remote;   Fair  Judgement:  Impaired  Insight:  Lacking  Psychomotor Activity:  Decreased  Concentration:  Fair  Recall:  Fair  Akathisia:  No  Handed:  Right  AIMS (if indicated):     Assets:  Financial Resources/Insurance  Sleep:  Number of Hours: 3.5    Current Medications: Current Facility-Administered Medications  Medication Dose Route Frequency Provider Last Rate  Last Dose  . acetaminophen (TYLENOL) tablet 650 mg  650 mg Oral Q6H PRN Verne Spurr, PA-C   650 mg at 04/11/12 2248  . alum & mag hydroxide-simeth (MAALOX/MYLANTA) 200-200-20 MG/5ML suspension 30 mL  30 mL Oral Q4H PRN Verne Spurr, PA-C   30 mL at 04/13/12 1804  . calamine lotion   Topical TID PRN Nelly Rout, MD      . cephALEXin (KEFLEX) capsule 500 mg  500 mg Oral TID Nelly Rout, MD   500 mg at 04/19/12 0747  . chlorproMAZINE (THORAZINE) tablet 100 mg  100 mg Oral TID PRN Nelly Rout, MD   100 mg at 04/18/12 1809  . citalopram (CELEXA) tablet 40 mg  40 mg Oral QHS Nelly Rout, MD   40 mg at 04/18/12 2159  . feeding supplement (ENSURE COMPLETE) liquid 237 mL  237 mL Oral TID BM Ashley Jacobs, RD      . loratadine (CLARITIN) tablet 10 mg  10 mg Oral Daily Nelly Rout, MD   10 mg at 04/19/12 0747  . lurasidone (LATUDA) tablet 80 mg  80 mg Oral QHS Casi Westerfeld, MD   80 mg at 04/18/12 2159  . magnesium hydroxide (MILK OF MAGNESIA) suspension 30 mL  30 mL Oral Daily PRN Verne Spurr, PA-C      . oxyCODONE-acetaminophen (PERCOCET/ROXICET) 5-325 MG per tablet 1 tablet  1 tablet Oral Q6H PRN Nelly Rout, MD   1 tablet at  04/18/12 2156  . pantoprazole (PROTONIX) EC tablet 20 mg  20 mg Oral Daily Nelly Rout, MD   20 mg at 04/19/12 0747  . PHENobarbital (LUMINAL) tablet 64.8 mg  64.8 mg Oral QHS Nelly Rout, MD   64.8 mg at 04/18/12 2159  . polyethylene glycol (MIRALAX / GLYCOLAX) packet 17 g  17 g Oral Daily Nelly Rout, MD      . selenium sulfide (SELSUN) 2.5 % shampoo   Topical Daily PRN Nelly Rout, MD      . simvastatin (ZOCOR) tablet 20 mg  20 mg Oral q1800 Nelly Rout, MD   20 mg at 04/18/12 1710    Lab Results: No results found for this or any previous visit (from the past 48 hour(s)).  Physical Findings: AIMS: Facial and Oral Movements Muscles of Facial Expression: None, normal Lips and Perioral Area: None, normal Jaw: None, normal Tongue: None,  normal,Extremity Movements Upper (arms, wrists, hands, fingers): None, normal Lower (legs, knees, ankles, toes): None, normal, Trunk Movements Neck, shoulders, hips: None, normal, Overall Severity Severity of abnormal movements (highest score from questions above): None, normal Incapacitation due to abnormal movements: None, normal Patient's awareness of abnormal movements (rate only patient's report): No Awareness, Dental Status Current problems with teeth and/or dentures?: No Does patient usually wear dentures?: No  CIWA:    COWS:     Treatment Plan Summary: Daily contact with patient to assess and evaluate symptoms and progress in treatment Medication management  Plan: Continue current plan of care. Plan for discharge once stable and guardian visits patient.   Medical Decision Making Problem Points:  Established problem, stable/improving (1), Review of last therapy session (1) and Review of psycho-social stressors (1) Data Points:  Review of medication regiment & side effects (2)  I certify that inpatient services furnished can reasonably be expected to improve the patient's condition.   Lori [name] 04/19/2012, 10:36 AM

## 2012-04-19 NOTE — Progress Notes (Signed)
Pt resting in bed with eyes closed. Respirations even and unlabored. Pt remains on 1:1 observation. No complaints. Will continue to monitor pt.

## 2012-04-19 NOTE — Progress Notes (Addendum)
D: Patient denies SI/HI and auditory and visual hallucinations. The patient has a depressed mood and affect. The patient rates her depression a 1 out of 10 (1 low/10 high) and denies any hopeless feelings. The patient reports sleeping well and states that her appetite is good and her energy level is fair. The patient is also irritable at times, stating that she "doesn't want water with her meds" (although she requested water) and that she "prefers female staff." The patient is incontinent of stool and urine and will only occasionally alert staff members to her needs. The patient requires at least two linen changes a shift. The patient is still delusional, telling RN that she's had "multiple brain surgeries" and that she and her girlfriend will adopt "12 kids from [place]."  A: Patient given emotional support from RN. Patient encouraged to come to staff with concerns and/or questions. Patient's medication routine continued. Patient's orders and plan of care reviewed.   R: Patient remains safe. Will continue to monitor patient q15 minutes for safety.

## 2012-04-19 NOTE — Progress Notes (Signed)
1:1 Note  Patient currently in day room interacting with other patients. Patient currently denies any questions or concerns. Patient states that she is "feeling good today." Will continue to monitor patient for safety.

## 2012-04-19 NOTE — Clinical Social Work Note (Signed)
[facility] LCSW Aftercare Discharge Planning Group Note       8:30-9:30 AM  12/27/20139:24 AM  Participation Quality:  Appropriate  Affect:  Appropriate  Cognitive:  Appropriate  Insight:  Engaged  Engagement in Group:  Engaged  Modes of Intervention:  Education, Exploration, Dentist and Support  Summary of Progress/Problems:  Patient shared she is doing okay today.  She denies SI/HI.  Patient rates depression at six and anxiety at ten.  She denies hopelessness/helplessness.  Patient informed Clinical research associate spoke with guardian and awaiting contact from her regarding looking for another group home placement.  Wynn Banker 04/19/2012 9:24 AM

## 2012-04-20 LAB — URINALYSIS, ROUTINE W REFLEX MICROSCOPIC
Glucose, UA: NEGATIVE mg/dL
Leukocytes, UA: NEGATIVE
Protein, ur: NEGATIVE mg/dL
Urobilinogen, UA: 0.2 mg/dL (ref 0.0–1.0)

## 2012-04-20 MED ORDER — NICOTINE POLACRILEX 2 MG MT GUM
2.0000 mg | CHEWING_GUM | OROMUCOSAL | Status: DC | PRN
  Administered 2012-04-20 – 2012-05-02 (×16): 2 mg via ORAL
  Filled 2012-04-20 (×4): qty 1

## 2012-04-20 NOTE — Clinical Social Work Psychosocial (Signed)
[facility] Group Notes:  (Clinical Social Work)  04/20/2012   3:00-4:00PM  Summary of Progress/Problems:   The main focus of today's process group was for the patient to identify ways in which they have in the past sabotaged their own recovery and reasons they may have done this/what they received from doing it.  We then worked to identify a specific plan to avoid doing this when discharged from the hospital for this admission.  The patient expressed that she self-sabotages by watching soap operas that she and her mother used to watch together.  She then starts to drink vodka, because that is what she and her mother would do for fun.  The patient left the room soon after sharing this, did not return.  Type of Therapy:  Group Therapy - Process  Participation Level:  Minimal  Participation Quality:  Attentive  Affect:  Blunted  Cognitive:  Oriented  Insight:  Improving  Engagement in Therapy:  Limited  Modes of Intervention:  Clarification, Education, Limit-setting, Problem-solving, Socialization, Support and Processing, Exploration, Discussion   Ambrose Mantle, LCSW 04/20/2012, 4:40 PM

## 2012-04-20 NOTE — Progress Notes (Signed)
Patient ID: Lori [name], female   DOB: [birth year], 41 y.o.   MRN: [id number]  1:1 Note:  D: Patient remains asleep; no s/s of distress noted. A: Patient remains on 1:1 observation for safety. R: No needs noted.

## 2012-04-20 NOTE — Progress Notes (Signed)
Patient ID: Lori [name], female   DOB: [birth year], 41 y.o.   MRN: [id number]  D: patient asleep in bed; no s/s of distress. A: Pt remains on 1:1 observation. R: Pt with no changes.

## 2012-04-20 NOTE — Progress Notes (Signed)
Patient ID: Lori [name], female   DOB: [birth year], 41 y.o.   MRN: [id number]  Problem: MDD  D: Patient labile and intrusive with staff, difficult to redirect at times. A: Monitor patient Q 15 minutes for safety, encourage peer interaction. Administer medications as ordered by MD, redirect behaviors as needed. R: Patient compliant with medications, but remains labile.

## 2012-04-20 NOTE — Progress Notes (Addendum)
[facility] MD Progress Note  04/20/2012 10:13 AM Lori [name]  MRN:  [id number] Subjective:   Asking for Nicorette gum as she is wanting to smoke. Does not want to return to placement she was in prior to admission. Told her she would need to be doing her ADL's. Will take off 1:1 once her cathed UA is obtained. Continues to void incontinent and this is thought to be behavioral as she didn't do this at rest home. Hasn't spoken to her guardian/payee but wants to to get her help in changing placement.  Diagnosis:   Axis I: Depressive Disorder NOS Axis II: Deferred Axis III:  Past Medical History  Diagnosis Date  . Spinal bifida, closed   . UTI (lower urinary tract infection)   . Seizures   . Cancer     colon  . Genital herpes   . Arthritis   . Hydrocephalus   . Psychoses    Axis IV: housing problems Axis V: 51-60 moderate symptoms  ADL's:  Impaired  Sleep: Fair  Appetite:  Fair   Psychiatric Specialty Exam: Review of Systems  Constitutional: Negative.   HENT: Negative.   Eyes: Negative.   Respiratory: Negative.   Cardiovascular: Negative.   Gastrointestinal: Negative.   Genitourinary: Negative.   Musculoskeletal: Positive for myalgias.  Skin: Negative.   Neurological: Negative.   Endo/Heme/Allergies: Negative.   Psychiatric/Behavioral: Positive for depression.    Blood pressure 115/68, pulse 105, temperature 98.3 F (36.8 C), temperature source Oral, resp. rate 20, SpO2 98.00%.There is no height or weight on file to calculate BMI.  General Appearance: Disheveled in wheelchair has on hospital gown   Eye Contact:  Fair  Speech:  Clear and Coherent  Volume:  Increased  Mood:  Anxious and Dysphoric  Affect:  Labile today is calm recognizes me   Thought Process:  Coherent  Orientation:  Full (Time, Place, and Person)  Thought Content:  WDL  Suicidal Thoughts:  No  Homicidal Thoughts:  No  Memory:  Immediate;   Fair Recent;   Fair Remote;   Fair  Judgement:  Impaired    Insight:  Lacking  Psychomotor Activity:  Decreased  Concentration:  Fair  Recall:  Fair  Akathisia:  No  Handed:  Right  AIMS (if indicated):     Assets:  Financial Resources/Insurance  Sleep:  Number of Hours: 6.5    Current Medications: Current Facility-Administered Medications  Medication Dose Route Frequency Provider Last Rate Last Dose  . acetaminophen (TYLENOL) tablet 650 mg  650 mg Oral Q6H PRN Verne Spurr, PA-C   650 mg at 04/11/12 2248  . alum & mag hydroxide-simeth (MAALOX/MYLANTA) 200-200-20 MG/5ML suspension 30 mL  30 mL Oral Q4H PRN Verne Spurr, PA-C   30 mL at 04/13/12 1804  . calamine lotion   Topical TID PRN Nelly Rout, MD      . cephALEXin (KEFLEX) capsule 500 mg  500 mg Oral TID Nelly Rout, MD   500 mg at 04/20/12 0756  . chlorproMAZINE (THORAZINE) tablet 100 mg  100 mg Oral TID PRN Nelly Rout, MD   100 mg at 04/20/12 0809  . citalopram (CELEXA) tablet 40 mg  40 mg Oral QHS Nelly Rout, MD   40 mg at 04/19/12 2111  . feeding supplement (ENSURE COMPLETE) liquid 237 mL  237 mL Oral TID BM Ashley Jacobs, RD      . loratadine (CLARITIN) tablet 10 mg  10 mg Oral Daily Nelly Rout, MD   10 mg at 04/20/12  72  . lurasidone (LATUDA) tablet 80 mg  80 mg Oral QHS Himabindu Ravi, MD   80 mg at 04/19/12 2110  . magnesium hydroxide (MILK OF MAGNESIA) suspension 30 mL  30 mL Oral Daily PRN Verne Spurr, PA-C      . nicotine polacrilex (NICORETTE) gum 2 mg  2 mg Oral Q2H PRN [name] Eliazar Olivar, PA      . oxyCODONE-acetaminophen (PERCOCET/ROXICET) 5-325 MG per tablet 1 tablet  1 tablet Oral Q6H PRN Nelly Rout, MD   1 tablet at 04/19/12 2110  . pantoprazole (PROTONIX) EC tablet 20 mg  20 mg Oral Daily Nelly Rout, MD   20 mg at 04/20/12 0756  . PHENobarbital (LUMINAL) tablet 64.8 mg  64.8 mg Oral QHS Nelly Rout, MD   64.8 mg at 04/19/12 2110  . polyethylene glycol (MIRALAX / GLYCOLAX) packet 17 g  17 g Oral Daily Nelly Rout, MD      . selenium sulfide  (SELSUN) 2.5 % shampoo   Topical Daily PRN Nelly Rout, MD      . simvastatin (ZOCOR) tablet 20 mg  20 mg Oral q1800 Nelly Rout, MD   20 mg at 04/19/12 1700    Lab Results: No results found for this or any previous visit (from the past 48 hour(s)).  Physical Findings: AIMS: Facial and Oral Movements Muscles of Facial Expression: None, normal Lips and Perioral Area: None, normal Jaw: None, normal Tongue: None, normal,Extremity Movements Upper (arms, wrists, hands, fingers): None, normal Lower (legs, knees, ankles, toes): None, normal, Trunk Movements Neck, shoulders, hips: None, normal, Overall Severity Severity of abnormal movements (highest score from questions above): None, normal Incapacitation due to abnormal movements: None, normal Patient's awareness of abnormal movements (rate only patient's report): No Awareness, Dental Status Current problems with teeth and/or dentures?: No Does patient usually wear dentures?: No  CIWA:    COWS:     Treatment Plan Summary: Daily contact with patient to assess and evaluate symptoms and progress in treatment Medication management  Plan: Continue current plan of care. Stop 1:1 today  Plan for discharge once stable and guardian visits patient.   Medical Decision Making Problem Points:  Established problem, stable/improving (1), Review of last therapy session (1) and Review of psycho-social stressors (1) Data Points:  Review of medication regiment & side effects (2)  I certify that inpatient services furnished can reasonably be expected to improve the patient's condition.   Nakita Santerre,[name] RPA-C CAQ-Psych  04/20/2012, 10:13 AM
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
## 2012-04-20 NOTE — Progress Notes (Signed)
Patient ID: Lori [name], female   DOB: Jul 10, 1970, 41 y.o.   MRN: [id number]  1:1 note  D: Pt asleep; respirations regular and unlabored. A: Sitter at bedside for 1:1 observation. R: No distress noted.

## 2012-04-20 NOTE — Progress Notes (Signed)
Patient ID: Lori [name], female   DOB: 06/11/1970, 41 y.o.   MRN: [id number]  Patient yelling across desk area in nurses station, demanding PRN medications. RN redirected behaviors instructing patient to go towards 500 hall medication room for meds. Pt labile, difficult to redirect. Pt given medications for anxiety and nicotine withdrawal.

## 2012-04-21 ENCOUNTER — Inpatient Hospital Stay (HOSPITAL_COMMUNITY): Payer: Medicare Other

## 2012-04-21 LAB — COMPREHENSIVE METABOLIC PANEL
AST: 16 U/L (ref 0–37)
BUN: 10 mg/dL (ref 6–23)
CO2: 24 mEq/L (ref 19–32)
Chloride: 102 mEq/L (ref 96–112)
Creatinine, Ser: 0.48 mg/dL — ABNORMAL LOW (ref 0.50–1.10)
GFR calc non Af Amer: >90 mL/min (ref >90)
Total Bilirubin: 0.1 mg/dL — ABNORMAL LOW (ref 0.3–1.2)

## 2012-04-21 LAB — CBC
HCT: 34.6 % — ABNORMAL LOW (ref 36.0–46.0)
Hemoglobin: 11.1 g/dL — ABNORMAL LOW (ref 12.0–15.0)
MCH: 28.5 pg (ref 26.0–34.0)
MCV: 88.7 fL (ref 78.0–100.0)
RBC: 3.9 MIL/uL (ref 3.87–5.11)

## 2012-04-21 LAB — POCT I-STAT TROPONIN I

## 2012-04-21 MED ORDER — IOHEXOL 350 MG/ML SOLN
100.0000 mL | Freq: Once | INTRAVENOUS | Status: AC | PRN
  Administered 2012-04-21: 100 mL via INTRAVENOUS

## 2012-04-21 NOTE — Progress Notes (Signed)
Patient ID: Lori [name], female   DOB: [birth year], 41 y.o.   MRN: [id number] Review of Systems  Cardiovascular: Positive for chest pain and leg swelling.  Gastrointestinal: Positive for abdominal pain.   Patient complained of chest pain.  Apical pulse 120, stat ECG done revealed Sinus Tachycardia Nonspecific ST and T wave abnormality.  Abd also tender to palpation.   ECG compared to ECG done 02/27/2012 and there was a change in presentation with the complaints of chest pain patient sent to ED.  Shuvon Rankin FNP

## 2012-04-21 NOTE — Progress Notes (Signed)
Patients guardian [name] notified of patients transfer to [facility] ED for evaluation of chest pain.
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
## 2012-04-21 NOTE — Progress Notes (Addendum)
[facility] MD Progress Note  04/21/2012 9:18 AM Lori [name]  MRN:  [id number] Subjective:  UA is totally clear. Patient is doing her own ADL's with minimal assist.Says she is practicing all her coping skills and thanks me for ordering the Nicorette  Gum. Feel she has reached maximum benefit from inpatient and should discharge as soon as placement is verified.She denies SI/HI . Is sleeping well and has no appetite issues.  Diagnosis:   Axis I: Depressive Disorder NOS Axis II: Deferred Axis III:  Past Medical History  Diagnosis Date  . Spinal bifida, closed   . UTI (lower urinary tract infection)   . Seizures   . Cancer     colon  . Genital herpes   . Arthritis   . Hydrocephalus   . Psychoses    Axis IV: housing problems Axis V: 51-60 moderate symptoms  ADL's:  Impaired  Sleep: Fair  Appetite:  Fair   Psychiatric Specialty Exam: Review of Systems  Constitutional: Negative.   HENT: Negative.   Eyes: Negative.   Respiratory: Negative.   Cardiovascular: Negative.   Gastrointestinal: Negative.   Genitourinary: Negative.   Musculoskeletal: Positive for myalgias.  Skin: Negative.   Neurological: Negative.   Endo/Heme/Allergies: Negative.   Psychiatric/Behavioral: Positive for depression.    Blood pressure 110/71, pulse 118, temperature 98.7 F (37.1 C), temperature source Oral, resp. rate 16, SpO2 98.00%.There is no height or weight on file to calculate BMI.  General Appearance: Disheveled in wheelchair has on hospital gown   Eye Contact:  Fair  Speech:  Clear and Coherent  Volume:  Increased  Mood:  Anxious and Dysphoric  Affect:  Labile today is calm recognizes me   Thought Process:  Coherent  Orientation:  Full (Time, Place, and Person)  Thought Content:  WDL  Suicidal Thoughts:  No  Homicidal Thoughts:  No  Memory:  Immediate;   Fair Recent;   Fair Remote;   Fair  Judgement:  Impaired  Insight:  Lacking  Psychomotor Activity:  Decreased  Concentration:  Fair    Recall:  Fair  Akathisia:  No  Handed:  Right  AIMS (if indicated):     Assets:  Financial Resources/Insurance  Sleep:  Number of Hours: 5.5    Current Medications: Current Facility-Administered Medications  Medication Dose Route Frequency Provider Last Rate Last Dose  . acetaminophen (TYLENOL) tablet 650 mg  650 mg Oral Q6H PRN Verne Spurr, PA-C   650 mg at 04/11/12 2248  . alum & mag hydroxide-simeth (MAALOX/MYLANTA) 200-200-20 MG/5ML suspension 30 mL  30 mL Oral Q4H PRN Verne Spurr, PA-C   30 mL at 04/13/12 1804  . calamine lotion   Topical TID PRN Nelly Rout, MD      . cephALEXin (KEFLEX) capsule 500 mg  500 mg Oral TID Nelly Rout, MD   500 mg at 04/21/12 0816  . chlorproMAZINE (THORAZINE) tablet 100 mg  100 mg Oral TID PRN Nelly Rout, MD   100 mg at 04/20/12 2127  . citalopram (CELEXA) tablet 40 mg  40 mg Oral QHS Nelly Rout, MD   40 mg at 04/20/12 2104  . feeding supplement (ENSURE COMPLETE) liquid 237 mL  237 mL Oral TID BM Ashley Jacobs, RD      . loratadine (CLARITIN) tablet 10 mg  10 mg Oral Daily Nelly Rout, MD   10 mg at 04/21/12 0817  . lurasidone (LATUDA) tablet 80 mg  80 mg Oral QHS Himabindu Ravi, MD   80 mg at  04/20/12 2105  . magnesium hydroxide (MILK OF MAGNESIA) suspension 30 mL  30 mL Oral Daily PRN Verne Spurr, PA-C      . nicotine polacrilex (NICORETTE) gum 2 mg  2 mg Oral Q2H PRN [name] Tobe Kervin, PA   2 mg at 04/21/12 1610  . oxyCODONE-acetaminophen (PERCOCET/ROXICET) 5-325 MG per tablet 1 tablet  1 tablet Oral Q6H PRN Nelly Rout, MD   1 tablet at 04/21/12 0819  . pantoprazole (PROTONIX) EC tablet 20 mg  20 mg Oral Daily Nelly Rout, MD   20 mg at 04/21/12 0817  . PHENobarbital (LUMINAL) tablet 64.8 mg  64.8 mg Oral QHS Nelly Rout, MD   64.8 mg at 04/20/12 2105  . polyethylene glycol (MIRALAX / GLYCOLAX) packet 17 g  17 g Oral Daily Nelly Rout, MD   17 g at 04/21/12 0816  . selenium sulfide (SELSUN) 2.5 % shampoo   Topical Daily  PRN Nelly Rout, MD      . simvastatin (ZOCOR) tablet 20 mg  20 mg Oral q1800 Nelly Rout, MD   20 mg at 04/20/12 1817    Lab Results:  Results for orders placed during the hospital encounter of 04/11/12 (from the past 48 hour(s))  URINALYSIS, ROUTINE W REFLEX MICROSCOPIC     Status: Normal   Collection Time   04/20/12 10:57 AM      Component Value Range Comment   Color, Urine YELLOW  YELLOW    APPearance CLEAR  CLEAR    Specific Gravity, Urine 1.007  1.005 - 1.030    pH 5.0  5.0 - 8.0    Glucose, UA NEGATIVE  NEGATIVE mg/dL    Hgb urine dipstick NEGATIVE  NEGATIVE    Bilirubin Urine NEGATIVE  NEGATIVE    Ketones, ur NEGATIVE  NEGATIVE mg/dL    Protein, ur NEGATIVE  NEGATIVE mg/dL    Urobilinogen, UA 0.2  0.0 - 1.0 mg/dL    Nitrite NEGATIVE  NEGATIVE    Leukocytes, UA NEGATIVE  NEGATIVE MICROSCOPIC NOT DONE ON URINES WITH NEGATIVE PROTEIN, BLOOD, LEUKOCYTES, NITRITE, OR GLUCOSE <1000 mg/dL.    Physical Findings: AIMS: Facial and Oral Movements Muscles of Facial Expression: None, normal Lips and Perioral Area: None, normal Jaw: None, normal Tongue: None, normal,Extremity Movements Upper (arms, wrists, hands, fingers): None, normal Lower (legs, knees, ankles, toes): None, normal, Trunk Movements Neck, shoulders, hips: None, normal, Overall Severity Severity of abnormal movements (highest score from questions above): None, normal Incapacitation due to abnormal movements: None, normal Patient's awareness of abnormal movements (rate only patient's report): No Awareness, Dental Status Current problems with teeth and/or dentures?: No Does patient usually wear dentures?: No  CIWA:    COWS:     Treatment Plan Summary: Daily contact with patient to assess and evaluate symptoms and progress in treatment Medication management  Plan: Continue current plan of care. !:1 discontinued yesterday  Plan for discharge.   Medical Decision Making Problem Points:  Established problem,  stable/improving (1), Review of last therapy session (1) and Review of psycho-social stressors (1) Data Points:  Review of medication regiment & side effects (2)  I certify that inpatient services furnished can reasonably be expected to improve the patient's condition.   Lori [name],[name] RPA-C CAQ-Psych  04/21/2012, 9:18 AM

## 2012-04-21 NOTE — Progress Notes (Signed)
Psychoeducational Group Note  Date:  04/21/2012 Time: 0945  Group Topic/Focus:  Self Inventory  Participation Level:  Did Not Attend   Lori [name] Lori [name] 04/21/2012, 10:35 AM

## 2012-04-21 NOTE — ED Notes (Signed)
[facility] called for tech to come and draw POC i-stat troponin.

## 2012-04-21 NOTE — Clinical Social Work Psychosocial (Signed)
[facility] Group Notes: (Clinical Social Work)   04/21/2012      Type of Therapy:  Group Therapy   Participation Level:  Did Not Attend    Ambrose Mantle, LCSW 04/21/2012, 4:01 PM

## 2012-04-21 NOTE — ED Notes (Signed)
Pt arrives by EMS from [facility] as a transfer to be evaluated for CP.
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
## 2012-04-21 NOTE — Progress Notes (Signed)
[facility] Group Notes:  (Counselor/Nursing/MHT/Case Management/Adjunct)  04/21/2012 1:06 AM  Type of Therapy:  wrap up group  Participation Level:  Active  Participation Quality:  Appropriate and Attentive  Affect:  Appropriate  Cognitive:  Appropriate  Insight:  Engaged  Engagement in Group:  Engaged  Engagement in Therapy:  Improving  Modes of Intervention:  Clarification, Education and Support  Summary of Progress/Problems:  Wrap up group ended by having each of the patients write down a goal that could be either long-term or short term and patients goal was to, "join the pet therapy group that is here because I really enjoyed that".   Tomi Bamberger Coursey 04/21/2012, 1:06 AM

## 2012-04-21 NOTE — ED Provider Notes (Signed)
History     CSN: [id number]  Arrival date & time 04/21/12  4098   First MD Initiated Contact with Patient 04/21/12 2025      Chief Complaint  Patient presents with  . Chest Pain    (Consider location/radiation/quality/duration/timing/severity/associated sxs/prior treatment) HPI Comments: Patient currently at [facility] for major depressive disorder and SI, h/o smoking, high cholesterol, 'borderline HTN', brother with MI -- presents with acute onset of right-sided chest pain at rest at approximately 7 PM tonight. Pain is described as sharp and is made worse with breathing, and movement of her arms. She denies injury to the area. She states that she has had numbness in her left arm and leg without weakness when this started. She was given nitroglycerin by EMS which gave her headache and decreased her pain to 6/10. She was also given aspirin en route. No history of heart attack. Symptoms were associated with some lightheadedness and nausea. Onset acute. Course is gradually improving. No history of blood clots, estrogens, recent travel or immobilization.  Patient is a 41 y.o. female presenting with chest pain. The history is provided by the patient and medical records.  Chest Pain Pertinent negatives for primary symptoms include no fever, no shortness of breath, no cough, no palpitations, no abdominal pain, no nausea and no vomiting.  Associated symptoms include numbness.  Pertinent negatives for associated symptoms include no diaphoresis and no weakness.     Past Medical History  Diagnosis Date  . Spinal bifida, closed   . UTI (lower urinary tract infection)   . Seizures   . Cancer     colon  . Genital herpes   . Arthritis   . Hydrocephalus   . Psychoses     Past Surgical History  Procedure Date  . Club foot release   . Spinal fusion   . Hip surgery   . Dental surgery   . Abdominal surgery   . Shunt revision 1975    No family history on file.  History    Substance Use Topics  . Smoking status: Current Every Day Smoker -- 2.0 packs/day for 1 years  . Smokeless tobacco: Not on file  . Alcohol Use: No     Comment: Rarely drinks and limits to no more than 4 drinks per episode    OB History    Grav Para Term Preterm Abortions TAB SAB Ect Mult Living                  Review of Systems  Constitutional: Negative for fever and diaphoresis.  HENT: Negative for neck pain.   Eyes: Negative for redness.  Respiratory: Negative for cough and shortness of breath.   Cardiovascular: Positive for chest pain. Negative for palpitations and leg swelling.  Gastrointestinal: Negative for nausea, vomiting and abdominal pain.  Genitourinary: Negative for dysuria.  Musculoskeletal: Negative for back pain.  Skin: Negative for rash.  Neurological: Positive for light-headedness and numbness. Negative for syncope and weakness.    Allergies  Advil; Bee venom; Crab; Morphine and related; Aleve; and Asa  Home Medications   Current Outpatient Rx  Name  Route  Sig  Dispense  Refill  . ALUM & MAG HYDROXIDE-SIMETH 200-200-20 MG/5ML PO SUSP   Oral   Take 30 mLs by mouth every 4 (four) hours as needed. indegestion         . CALAMINE EX LOTN   Topical   Apply 1 application topically 3 (three) times daily as needed. Itching         .  CHLORPROMAZINE HCL 100 MG PO TABS   Oral   Take 100 mg by mouth every 2 (two) hours as needed. anxiety         . CITALOPRAM HYDROBROMIDE 40 MG PO TABS   Oral   Take 40 mg by mouth every morning.          Marland Kitchen BLISTEX LIP EX OINT   Apply externally   Apply 1 application topically 3 (three) times daily as needed. For dry lips         . HYPROMELLOSE 2.5 % OP SOLN   Both Eyes   Place 1 drop into both eyes daily as needed. Dry Eyes         . LORATADINE 10 MG PO TABS   Oral   Take 10 mg by mouth daily as needed. Allergies         . LURASIDONE HCL 40 MG PO TABS   Oral   Take 120 mg by mouth at bedtime. Take  with 80 mg tablet to make 120 mg         . LURASIDONE HCL 80 MG PO TABS   Oral   Take 80 mg by mouth at bedtime. Take with 40mg  tablet to make 120mg          . OXYCODONE-ACETAMINOPHEN 5-325 MG PO TABS   Oral   Take 1 tablet by mouth every 6 (six) hours as needed. Pain.         Marland Kitchen PANTOPRAZOLE SODIUM 20 MG PO TBEC   Oral   Take 20 mg by mouth daily.          Marland Kitchen PHENOBARBITAL 64.8 MG PO TABS   Oral   Take 64.8 mg by mouth at bedtime.         Marland Kitchen POLYETHYLENE GLYCOL 3350 PO PACK   Oral   Take 17 g by mouth daily as needed. For constipation         . PRAVASTATIN SODIUM 40 MG PO TABS   Oral   Take 40 mg by mouth every morning.          . SELENIUM SULFIDE 1 % EX LOTN   Topical   Apply 1 application topically daily as needed. For dry itchy scalp           BP 134/54  Pulse 113  Temp 98.2 F (36.8 C) (Oral)  Resp 18  SpO2 100%  Physical Exam  Nursing note and vitals reviewed. Constitutional: She appears well-developed and well-nourished.  HENT:  Head: Normocephalic and atraumatic.  Mouth/Throat: Mucous membranes are normal. Mucous membranes are not dry.  Eyes: Conjunctivae normal are normal.  Neck: Trachea normal and normal range of motion. Neck supple. Normal carotid pulses and no JVD present. No muscular tenderness present. Carotid bruit is not present. No tracheal deviation present.  Cardiovascular: Regular rhythm, S1 normal, S2 normal, normal heart sounds and intact distal pulses.  Tachycardia present.  Exam reveals no decreased pulses.   No murmur heard. Pulses:      Radial pulses are 2+ on the right side, and 2+ on the left side.  Pulmonary/Chest: Effort normal. No respiratory distress. She has no wheezes. She exhibits tenderness (generalized).  Abdominal: Soft. Normal aorta and bowel sounds are normal. There is no tenderness. There is no rebound and no guarding.  Musculoskeletal: Normal range of motion.       No significant LE edema. She is wearing leg  braces.   Neurological: She is alert.  Skin: Skin is  warm and dry. She is not diaphoretic. No cyanosis. No pallor.  Psychiatric: She has a normal mood and affect.    ED Course  Procedures (including critical care time)  Labs Reviewed  CBC - Abnormal; Notable for the following:    Hemoglobin 11.1 (*)     HCT 34.6 (*)     All other components within normal limits  D-DIMER, QUANTITATIVE - Abnormal; Notable for the following:    D-Dimer, Quant 0.54 (*)     All other components within normal limits  COMPREHENSIVE METABOLIC PANEL - Abnormal; Notable for the following:    Creatinine, Ser 0.48 (*)     Albumin 3.2 (*)     Alkaline Phosphatase 207 (*)     Total Bilirubin 0.1 (*)     All other components within normal limits  URINALYSIS, ROUTINE W REFLEX MICROSCOPIC  POCT I-STAT TROPONIN I  POCT I-STAT TROPONIN I   Dg Chest 2 View  04/21/2012  *RADIOLOGY REPORT*  Clinical Data: Chest pain radiating to left arm.  CHEST - 2 VIEW  Comparison: 02/28/2012  Findings: Low lung volumes are seen, however both lungs are clear. Heart size is normal.  No evidence of pleural effusion.  No mass or lymphadenopathy identified.  Patient is partially rotated to the right.  IMPRESSION: Low lung volumes.  No active disease.   Original Report Authenticated By: Myles Rosenthal, M.D.    Ct Angio Chest Pe W/cm &/or Wo Cm  04/21/2012  *RADIOLOGY REPORT*  Clinical Data: Chest pain.  Elevated D-dimer.  Previous history of blood clots.  CT ANGIOGRAPHY CHEST  Technique:  Multidetector CT imaging of the chest using the standard protocol during bolus administration of intravenous contrast. Multiplanar reconstructed images including MIPs were obtained and reviewed to evaluate the vascular anatomy.  Contrast: OMNIPAQUE IOHEXOL 350 MG/ML SOLN  Comparison: None.  Findings: Satisfactory opacification of the pulmonary arteries noted, and there is no evidence of pulmonary emboli.  No evidence of thoracic aortic aneurysm or  dissection.  No evidence of mediastinal hematoma or mass.  No adenopathy seen elsewhere within the thorax.  No evidence of pleural or pericardial effusion.  Both lungs are clear.  No evidence of central endobronchial lesion.  No suspicious bone lesions identified.  IMPRESSION: Negative.  No evidence of pulmonary embolism or other active disease.   Original Report Authenticated By: Myles Rosenthal, M.D.      1. Severe major depression with psychotic features, mood-incongruent   2. MDD (major depressive disorder), recurrent, severe, with psychosis   3. Urinary tract infection    8:45 PM Patient seen and examined. Work-up initiated. Medications ordered. ASA by EMS, NTG by EMS. Pain to 6/10. Patient has been tachycardic at [facility] since yesterday, prior to CP.   Vital signs reviewed and are as follows: Filed Vitals:   04/21/12 2017  BP: 134/54  Pulse: 113  Temp: 98.2 F (36.8 C)  Resp: 18    Date: 04/21/2012  Rate: 113  Rhythm: sinus tachycardia  QRS Axis: normal  Intervals: normal  ST/T Wave abnormalities: nonspecific T wave changes  Conduction Disutrbances:none  Narrative Interpretation:   Old EKG Reviewed: changes noted from 02/28/12, new tachycardia. She had flat t-waves anteriorly at that time and today's EKG does not look appreciably different.   10:07 PM Patient stable. Discussed with Dr. Lorenso Courier. Mild elevation in d-dimer. Will need CT to r/o PE as cause of pain and tachycardia. Patient aware of plan.   12:38 AM CT reviewed by myself and  is negative. 2nd troponin is negative. Pt informed of results. She states she is feeling better. Will transfer back to [facility].   Findings reviewed with Dr. Lorenso Courier.   Patient was counseled to return with severe chest pain, especially if the pain is crushing or pressure-like and spreads to the arms, back, neck, or jaw, or if they have sweating, nausea, or shortness of breath with the pain. They were encouraged to call 911 with these symptoms.   They were  also told to return if their chest pain gets worse and does not go away with rest, they have an attack of chest pain lasting longer than usual despite rest and treatment with the medications their caregiver has prescribed, if they wake from sleep with chest pain or shortness of breath, if they feel dizzy or faint, if they have chest pain not typical of their usual pain, or if they have any other emergent concerns regarding their health.  The patient verbalized understanding and agreed.   MDM  Chest pain with reproducible and pleuritic component. 2 sets trop neg. D-dimer elevated and adequate CT does not demonstrate PE or other cause of pain. EKG is unchanged from previous other than tachycardia. Pain resolved here. Feel this is likely musculoskeletal as pain is reproducible.         Renne Crigler, [place] 04/22/12 [phone]

## 2012-04-22 DIAGNOSIS — F323 Major depressive disorder, single episode, severe with psychotic features: Secondary | ICD-10-CM | POA: Diagnosis not present

## 2012-04-22 DIAGNOSIS — F333 Major depressive disorder, recurrent, severe with psychotic symptoms: Secondary | ICD-10-CM | POA: Diagnosis not present

## 2012-04-22 NOTE — Progress Notes (Signed)
[facility] LCSW Group Therapy  04/22/2012 4:01 PM  Type of Therapy:  Group Therapy  Participation Level:  Did Not Attend    Nail, Catalina Gravel 04/22/2012, 4:01 PM

## 2012-04-22 NOTE — Clinical Social Work Note (Signed)
CSW spoke with with pt's guardian, [name] [phone]) on this date.  [name] states that she hasn't had the opportunity to reach anyone about placement due to them being closed for the holidays.  [name] states that she will work on placement now.  CSW informed [name] that we were looking at d/c soon.    Reyes Ivan, [place] 04/22/2012  12:34 PM

## 2012-04-22 NOTE — Progress Notes (Signed)
Patient ID: Lori [name], female   DOB: 1970-07-29, 41 y.o.   MRN: [id number] D) Pt. Has been needy and and demanding. Affect very labile, crying, and very sensitive to comments of peers.  Pt. Has been incontinent of urine and feces x 2, requiring washing and changing. Skin becoming irritable on gluteus.  Pt. Refuses to use BR when cued to do so.  Becomes irritable and volatile at times when encouraged to complete ADLs. Refused to brush teeth, or take braces off at HS.  A) pt. Supported and provided with assistance for ADLs.  Emotional support offered and time to talk provided throughout shift.  Pt. Given PRN medications as needed for Pain in Left knee (went from a 10 down to a 3).  Also used thorazine for relief of anxiety. Requesting barrier cream.  R) pt. Cooperative, but mood/behavior labile.  Periods of tearfulness intertwined with periods of anger. Pt. Cont. Safe on q 15 min. Observations.

## 2012-04-22 NOTE — Progress Notes (Signed)
04/22/2012 0200  Report called to [facility] from [facility] ED by Marda Stalker RN.  ED preparing to call for transport back to [facility] along with 1:1.  Pt chest pain resolved without intervention as of 2300.  Troponins negative x2.  Pt has elevated D-dimer, which was worked up with no active disease or embolic process detected.  Pt received HS Celexa, Latuda, and phenobarbital in ED at approximately 2335.  Also was given Keflex 500mg  PO @2115 .  Vitals prior to report being called: BP 126/57, HR 115 (sinus tach), RR 22, SaO2=100% on room air.  Pt reported to be calm and cooperative.  Has not toileted at [facility] except for to give a urine sample @ 2015.  Guardian to be notified upon Pt's arrival back at [facility]. Dion Saucier RN

## 2012-04-22 NOTE — Progress Notes (Signed)
Patient ID: Lori [name], female   DOB: 25-Dec-1970, 41 y.o.   MRN: [id number] D Patient was irritable upon awakening .  Stated she was angry because they did not give her any results after ED visit.  Wanted to sleep and initially did not want meds.  Took meds upon awakening.  Had very wet depends upon awakening.  Assisted patient with ADL's and changed diaper and gown.  Patient took a bath yesterday. Patient was conversational talking about her spina bifida and about her family.  She talked about using coffee to stimulate BM in the am and refused miralax saying it causes diarrhea.  Patient got up in wheelchair and went to group.  A- Asked patient if she was suicidal and she yelled "no"  Asked her to fill out her self inventory and R-she said angrily " I'm not going to fill that out again." Then began to tell staff she was now feeling suicidal and homicidal toward this RN for asking her about suicidality.  Has been sharing these feelings with multiple staff members and the MD.  Patient stated she wants to be back on a 1:1.  Staff aware and monitoring patient.

## 2012-04-22 NOTE — Progress Notes (Signed)
Pt returned from [facility] ED via [facility] @0230 .  Guardian notified @0245 .  EMS reports that Pt was soaked in urine when picked up at hospital.  Pt cleaned and transported to [facility].  Denies need to toilet.  Fell asleep immediately following return assessment and taking of vitals.  Sticky note placed for treatment team to request guardian to provide Depends for Pt use while at [facility].  Q15 minute safety checks restarted upon arrival as per unit protocol.  Safety maintained. Blair Dolphin RN

## 2012-04-22 NOTE — Progress Notes (Signed)
[facility] MD Progress Note  04/22/2012 11:45 AM Lori [name]  MRN:  [id number] Subjective:  Patient ambulating in her wheel chair. She denies any chest pain this morning. She was taken to the ER after she c/o chest pain last night. She is endorsing suicidal thoughts today. Diagnosis:   Axis I: Major Depression, Recurrent severe Axis II: Deferred Axis III:  Past Medical History  Diagnosis Date  . Spinal bifida, closed   . UTI (lower urinary tract infection)   . Seizures   . Cancer     colon  . Genital herpes   . Arthritis   . Hydrocephalus   . Psychoses    Axis IV: housing problems and other psychosocial or environmental problems Axis V: 51-60 moderate symptoms  ADL's:  Impaired  Sleep: Poor  Appetite:  Fair  Psychiatric Specialty Exam: Review of Systems  Constitutional: Negative.   HENT: Negative.   Eyes: Negative.   Respiratory: Negative.   Cardiovascular: Negative.   Gastrointestinal: Negative.   Genitourinary:       Patient incontinent secondary to spina bifida.  Musculoskeletal: Negative.   Skin: Negative.   Neurological: Negative.   Endo/Heme/Allergies: Negative.   Psychiatric/Behavioral: Positive for depression and suicidal ideas.    Blood pressure 94/60, pulse 101, temperature 98.6 F (37 C), temperature source Oral, resp. rate 18, SpO2 96.00%.There is no height or weight on file to calculate BMI.  General Appearance: Disheveled  Eye Solicitor::  Fair  Speech:  Clear and Coherent  Volume:  Normal  Mood:  Depressed, Dysphoric and Irritable  Affect:  Constricted and Labile  Thought Process:  Coherent  Orientation:  Full (Time, Place, and Person)  Thought Content:  WDL  Suicidal Thoughts:  Yes.  without intent/plan  Homicidal Thoughts:  No  Memory:  Immediate;   Fair Recent;   Fair Remote;   Fair  Judgement:  Impaired  Insight:  Lacking  Psychomotor Activity:  reduced  Concentration:  Fair  Recall:  Fair  Akathisia:  No  Handed:  Right  AIMS (if  indicated):     Assets:  Communication Skills  Sleep:  Number of Hours: 3    Current Medications: Current Facility-Administered Medications  Medication Dose Route Frequency Provider Last Rate Last Dose  . acetaminophen (TYLENOL) tablet 650 mg  650 mg Oral Q6H PRN Verne Spurr, PA-C   650 mg at 04/11/12 2248  . alum & mag hydroxide-simeth (MAALOX/MYLANTA) 200-200-20 MG/5ML suspension 30 mL  30 mL Oral Q4H PRN Verne Spurr, PA-C   30 mL at 04/13/12 1804  . calamine lotion   Topical TID PRN Nelly Rout, MD      . cephALEXin (KEFLEX) capsule 500 mg  500 mg Oral TID Nelly Rout, MD   500 mg at 04/22/12 0913  . chlorproMAZINE (THORAZINE) tablet 100 mg  100 mg Oral TID PRN Nelly Rout, MD   100 mg at 04/22/12 1046  . citalopram (CELEXA) tablet 40 mg  40 mg Oral QHS Nelly Rout, MD   40 mg at 04/21/12 2335  . feeding supplement (ENSURE COMPLETE) liquid 237 mL  237 mL Oral TID BM Ashley Jacobs, RD   237 mL at 04/22/12 0915  . loratadine (CLARITIN) tablet 10 mg  10 mg Oral Daily Nelly Rout, MD   10 mg at 04/22/12 0914  . lurasidone (LATUDA) tablet 80 mg  80 mg Oral QHS Felesia Stahlecker, MD   80 mg at 04/21/12 2337  . magnesium hydroxide (MILK OF MAGNESIA) suspension 30 mL  30 mL Oral Daily PRN Verne Spurr, PA-C      . nicotine polacrilex (NICORETTE) gum 2 mg  2 mg Oral Q2H PRN [name] Adams, PA   2 mg at 04/22/12 1123  . oxyCODONE-acetaminophen (PERCOCET/ROXICET) 5-325 MG per tablet 1 tablet  1 tablet Oral Q6H PRN Nelly Rout, MD   1 tablet at 04/21/12 0819  . pantoprazole (PROTONIX) EC tablet 20 mg  20 mg Oral Daily Nelly Rout, MD   20 mg at 04/22/12 0914  . PHENobarbital (LUMINAL) tablet 64.8 mg  64.8 mg Oral QHS Nelly Rout, MD   64.8 mg at 04/21/12 2338  . polyethylene glycol (MIRALAX / GLYCOLAX) packet 17 g  17 g Oral Daily Nelly Rout, MD   17 g at 04/21/12 0816  . selenium sulfide (SELSUN) 2.5 % shampoo   Topical Daily PRN Nelly Rout, MD      . simvastatin (ZOCOR)  tablet 20 mg  20 mg Oral q1800 Nelly Rout, MD   20 mg at 04/21/12 1711    Lab Results:  Results for orders placed during the hospital encounter of 04/11/12 (from the past 48 hour(s))  CBC     Status: Abnormal   Collection Time   04/21/12  9:04 PM      Component Value Range Comment   WBC 5.9  4.0 - 10.5 K/uL    RBC 3.90  3.87 - 5.11 MIL/uL    Hemoglobin 11.1 (*) 12.0 - 15.0 g/dL    HCT 91.4 (*) 78.2 - 46.0 %    MCV 88.7  78.0 - 100.0 fL    MCH 28.5  26.0 - 34.0 pg    MCHC 32.1  30.0 - 36.0 g/dL    RDW 95.6  21.3 - 08.6 %    Platelets 325  150 - 400 K/uL   D-DIMER, QUANTITATIVE     Status: Abnormal   Collection Time   04/21/12  9:04 PM      Component Value Range Comment   D-Dimer, Quant 0.54 (*) 0.00 - 0.48 ug/mL-FEU   COMPREHENSIVE METABOLIC PANEL     Status: Abnormal   Collection Time   04/21/12  9:04 PM      Component Value Range Comment   Sodium 139  135 - 145 mEq/L    Potassium 3.7  3.5 - 5.1 mEq/L    Chloride 102  96 - 112 mEq/L    CO2 24  19 - 32 mEq/L    Glucose, Bld 98  70 - 99 mg/dL    BUN 10  6 - 23 mg/dL    Creatinine, Ser 5.78 (*) 0.50 - 1.10 mg/dL    Calcium 9.1  8.4 - 46.9 mg/dL    Total Protein 6.6  6.0 - 8.3 g/dL    Albumin 3.2 (*) 3.5 - 5.2 g/dL    AST 16  0 - 37 U/L    ALT 20  0 - 35 U/L    Alkaline Phosphatase 207 (*) 39 - 117 U/L    Total Bilirubin 0.1 (*) 0.3 - 1.2 mg/dL    GFR calc non Af Amer >90  >90 mL/min    GFR calc Af Amer >90  >90 mL/min   POCT I-STAT TROPONIN I     Status: Normal   Collection Time   04/21/12  9:15 PM      Component Value Range Comment   Troponin i, poc 0.00  0.00 - 0.08 ng/mL    Comment 3  POCT I-STAT TROPONIN I     Status: Normal   Collection Time   04/22/12 12:18 AM      Component Value Range Comment   Troponin i, poc 0.00  0.00 - 0.08 ng/mL    Comment 3              Physical Findings: AIMS: Facial and Oral Movements Muscles of Facial Expression: None, normal Lips and Perioral Area: None,  normal Jaw: None, normal Tongue: None, normal,Extremity Movements Upper (arms, wrists, hands, fingers): None, normal Lower (legs, knees, ankles, toes): None, normal, Trunk Movements Neck, shoulders, hips: None, normal, Overall Severity Severity of abnormal movements (highest score from questions above): None, normal Incapacitation due to abnormal movements: None, normal Patient's awareness of abnormal movements (rate only patient's report): No Awareness, Dental Status Current problems with teeth and/or dentures?: No Does patient usually wear dentures?: No  CIWA:  CIWA-Ar Total: 0  COWS:     Treatment Plan Summary: Daily contact with patient to assess and evaluate symptoms and progress in treatment Medication management  Plan: Continue current plan of care. Discussed with patient to use coping skills when she is upset (since she was distressed by an interaction with clinical staff that caused her to have suicidal thoughts). Patient amenable to this suggestion. Contact her group home to plan discharge.  Medical Decision Making Problem Points:  Established problem, stable/improving (1), Review of last therapy session (1) and Review of psycho-social stressors (1) Data Points:  Review of medication regiment & side effects (2)  I certify that inpatient services furnished can reasonably be expected to improve the patient's condition.   Lori [name] 04/22/2012, 11:45 AM

## 2012-04-22 NOTE — Social Work (Signed)
Interdisciplinary Treatment Plan Update (Adult)  Date:  04/22/2012  Time Reviewed:  6:27 AM    Progress in Treatment: Attending groups:   Minimally   Participating in groups: No Taking medication as prescribed:  Yes Tolerating medication:  Yes Family/Significant othe contact made: Contact made with family Patient understands diagnosis:  Yes Discussing patient identified problems/goals with staff: Yes Medical problems stabilized or resolved: No Denies suicidal/homicidal ideation: No -  Patient able to contract for safety Issues/concerns per patient self-inventory:  Other:  New problem(s) identified: Patient reports attending groups makes her sad and tearful. Staff reports patient was irritable last night and asked to go to the ER for chest pain. M.D. believes patient is at baseline and is looking at discharge today.  Reason for Continuation of Hospitalization:   Interventions implemented related to continuation of hospitalization:  Medication mgement; safety checks q 15 mins; coping skills development  Additional comments: Discharge today  Estimated length of stay: Discharging today  Discharge Plan:  Outpatient follow up scheduled  New goal(s):  Review of initial/current patient goals per problem list:    1.  Goal(s): Eliminate SI/other thoughts of self harm   Met:  No  Target date: d/c  As evidenced by: Patient will no longer endorse SI/HI or other thoughts of self harm.    2.  Goal (s):Reduce depression/anxiety  Met: Yes  Target date: d/c  As evidenced by: Patient will rate symptoms at four or below    3.  Goal(s):.stabilize on meds   Met:  Yes  Target date: d/c  As evidenced by: Patient will report being stabilized on medications - less symptomatic    4.  Goal(s): Refer for outpatient follow up   Met:  Yes  Target date: d/c  As evidenced by: Follow up appointment is scheduled    Attendees: Patient:   @TD  6:27 AM  Physican:   Patrick North, MD @TD  6:27 AM  Nursing:  Neill Loft, RN  04/22/2012 6:27 AM  Nursing:    @TD  6:27 AM  Clinical Social Worker:  Patton Salles, LCSW @TD  6:27 AM  Other: JamiesonLord -NP 04/22/2012 6:27 AM   Other:         04/22/2012 6:27 AM Other:

## 2012-04-22 NOTE — Care Management Utilization Note (Signed)
   Per State Regulation 482.30  This chart was reviewed for necessity with respect to the patient's Admission/ Duration of stay.  Next review date: 04/25/12  Kaled Allende Simmons RN, BSN 

## 2012-04-22 NOTE — ED Provider Notes (Signed)
Medical screening examination/treatment/procedure(s) were performed by non-physician practitioner and as supervising physician I was immediately available for consultation/collaboration.  Tobin Chad, MD 04/22/12 [phone]
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
## 2012-04-22 NOTE — Progress Notes (Signed)
Patient ID: Lori [name], female   DOB: Dec 05, 1970, 41 y.o.   MRN: [id number] Patient has a blister on her L foot. It is unbroken and not draining. She declined to have it dressed or padded.    Patient had large incontinent movement of urine and bowels that needed to be cleaned up.  She is now resting as she reports poor sleep last night due to trip to ED

## 2012-04-22 NOTE — Social Work (Signed)
Pt attended discharge planning group 45 minutes and did not participate participated in group.  CSW provided pt with today's workbook. Suicide education and and prevention were discussed in today's group. Patient only attended last 5 minutes of group.

## 2012-04-23 NOTE — Progress Notes (Signed)
Psychoeducational Group Note  Date:  04/23/2012 Time:  1000  Group Topic/Focus:  Coping Skills Pictionary  Participation Level:  Active  Participation Quality:  Appropriate, Attentive and Sharing  Affect:  Appropriate  Cognitive:  Alert and Appropriate  Insight:  Engaged  Engagement in Group:  Engaged  Additional Comments:  Pt was appropriate and attentive while attending group. Pt was willing to draw one of the chosen coping skills.  Sharyn Lull 04/23/2012, 10:42 AM

## 2012-04-23 NOTE — Progress Notes (Signed)
Psychoeducational Group Note  Date:  04/23/2012 Time:  1100  Group Topic/Focus:  Therapeutic Activity: Question Ball  Participation Level:  Active  Participation Quality:  Intrusive  Affect:  Excited  Cognitive:  Appropriate  Insight:  Engaged  Engagement in Group:  Engaged  Additional Comments:   Barton Fanny 04/23/2012, 3:28 PM

## 2012-04-23 NOTE — Progress Notes (Signed)
[facility] MD Progress Note  04/23/2012 11:15 AM Lori [name]  MRN:  [id number] Subjective:  Patient reports that her mood is improved today, denies suicidal thoughts. Ambulating on her crutches in the hallway.  Diagnosis:   Axis I: Depressive Disorder NOS Axis II: Deferred Axis III:  Past Medical History  Diagnosis Date  . Spinal bifida, closed   . UTI (lower urinary tract infection)   . Seizures   . Cancer     colon  . Genital herpes   . Arthritis   . Hydrocephalus   . Psychoses    Axis IV: housing problems Axis V: 51-60 moderate symptoms  ADL's:  Impaired  Sleep: Fair  Appetite:  Fair  Psychiatric Specialty Exam: Review of Systems  Constitutional: Negative.   HENT: Negative.   Eyes: Negative.   Respiratory: Negative.   Cardiovascular: Negative.   Gastrointestinal: Negative.   Genitourinary: Negative.   Musculoskeletal: Negative.   Skin: Negative.   Neurological: Positive for focal weakness.  Endo/Heme/Allergies: Negative.   Psychiatric/Behavioral: Positive for depression.    Blood pressure 109/73, pulse 102, temperature 98 F (36.7 C), temperature source Oral, resp. rate 18, SpO2 96.00%.There is no height or weight on file to calculate BMI.  General Appearance: Disheveled  Eye Solicitor::  Fair  Speech:  Clear and Coherent  Volume:  Normal  Mood:  Dysphoric  Affect:  Constricted  Thought Process:  Coherent  Orientation:  Full (Time, Place, and Person)  Thought Content:  WDL  Suicidal Thoughts:  No  Homicidal Thoughts:  No  Memory:  Immediate;   Fair Recent;   Fair Remote;   Fair  Judgement:  Fair  Insight:  Fair  Psychomotor Activity:  Decreased  Concentration:  Fair  Recall:  Fair  Akathisia:  No  Handed:  Right  AIMS (if indicated):     Assets:  Communication Skills Desire for Improvement  Sleep:  Number of Hours: 5.75    Current Medications: Current Facility-Administered Medications  Medication Dose Route Frequency Provider Last Rate Last  Dose  . acetaminophen (TYLENOL) tablet 650 mg  650 mg Oral Q6H PRN Verne Spurr, PA-C   650 mg at 04/11/12 2248  . alum & mag hydroxide-simeth (MAALOX/MYLANTA) 200-200-20 MG/5ML suspension 30 mL  30 mL Oral Q4H PRN Verne Spurr, PA-C   30 mL at 04/22/12 1858  . calamine lotion   Topical TID PRN Nelly Rout, MD      . cephALEXin (KEFLEX) capsule 500 mg  500 mg Oral TID Nelly Rout, MD   500 mg at 04/23/12 0815  . chlorproMAZINE (THORAZINE) tablet 100 mg  100 mg Oral TID PRN Nelly Rout, MD   100 mg at 04/22/12 2025  . citalopram (CELEXA) tablet 40 mg  40 mg Oral QHS Nelly Rout, MD   40 mg at 04/22/12 2214  . feeding supplement (ENSURE COMPLETE) liquid 237 mL  237 mL Oral TID BM Ashley Jacobs, RD   237 mL at 04/22/12 2144  . loratadine (CLARITIN) tablet 10 mg  10 mg Oral Daily Nelly Rout, MD   10 mg at 04/23/12 0815  . lurasidone (LATUDA) tablet 80 mg  80 mg Oral QHS Janya Eveland, MD   80 mg at 04/22/12 2214  . magnesium hydroxide (MILK OF MAGNESIA) suspension 30 mL  30 mL Oral Daily PRN Verne Spurr, PA-C      . nicotine polacrilex (NICORETTE) gum 2 mg  2 mg Oral Q2H PRN [name] Adams, PA   2 mg at 04/22/12  1809  . oxyCODONE-acetaminophen (PERCOCET/ROXICET) 5-325 MG per tablet 1 tablet  1 tablet Oral Q6H PRN Nelly Rout, MD   1 tablet at 04/22/12 2038  . pantoprazole (PROTONIX) EC tablet 20 mg  20 mg Oral Daily Nelly Rout, MD   20 mg at 04/23/12 0815  . PHENobarbital (LUMINAL) tablet 64.8 mg  64.8 mg Oral QHS Nelly Rout, MD   64.8 mg at 04/22/12 2215  . polyethylene glycol (MIRALAX / GLYCOLAX) packet 17 g  17 g Oral Daily Nelly Rout, MD   17 g at 04/21/12 0816  . selenium sulfide (SELSUN) 2.5 % shampoo   Topical Daily PRN Nelly Rout, MD      . simvastatin (ZOCOR) tablet 20 mg  20 mg Oral q1800 Nelly Rout, MD   20 mg at 04/22/12 1806    Lab Results:  Results for orders placed during the hospital encounter of 04/11/12 (from the past 48 hour(s))  CBC      Status: Abnormal   Collection Time   04/21/12  9:04 PM      Component Value Range Comment   WBC 5.9  4.0 - 10.5 K/uL    RBC 3.90  3.87 - 5.11 MIL/uL    Hemoglobin 11.1 (*) 12.0 - 15.0 g/dL    HCT 47.8 (*) 29.5 - 46.0 %    MCV 88.7  78.0 - 100.0 fL    MCH 28.5  26.0 - 34.0 pg    MCHC 32.1  30.0 - 36.0 g/dL    RDW 62.1  30.8 - 65.7 %    Platelets 325  150 - 400 K/uL   D-DIMER, QUANTITATIVE     Status: Abnormal   Collection Time   04/21/12  9:04 PM      Component Value Range Comment   D-Dimer, Quant 0.54 (*) 0.00 - 0.48 ug/mL-FEU   COMPREHENSIVE METABOLIC PANEL     Status: Abnormal   Collection Time   04/21/12  9:04 PM      Component Value Range Comment   Sodium 139  135 - 145 mEq/L    Potassium 3.7  3.5 - 5.1 mEq/L    Chloride 102  96 - 112 mEq/L    CO2 24  19 - 32 mEq/L    Glucose, Bld 98  70 - 99 mg/dL    BUN 10  6 - 23 mg/dL    Creatinine, Ser 8.46 (*) 0.50 - 1.10 mg/dL    Calcium 9.1  8.4 - 96.2 mg/dL    Total Protein 6.6  6.0 - 8.3 g/dL    Albumin 3.2 (*) 3.5 - 5.2 g/dL    AST 16  0 - 37 U/L    ALT 20  0 - 35 U/L    Alkaline Phosphatase 207 (*) 39 - 117 U/L    Total Bilirubin 0.1 (*) 0.3 - 1.2 mg/dL    GFR calc non Af Amer >90  >90 mL/min    GFR calc Af Amer >90  >90 mL/min   POCT I-STAT TROPONIN I     Status: Normal   Collection Time   04/21/12  9:15 PM      Component Value Range Comment   Troponin i, poc 0.00  0.00 - 0.08 ng/mL    Comment 3            POCT I-STAT TROPONIN I     Status: Normal   Collection Time   04/22/12 12:18 AM      Component Value Range Comment  Troponin i, poc 0.00  0.00 - 0.08 ng/mL    Comment 3              Physical Findings: AIMS: Facial and Oral Movements Muscles of Facial Expression:  (Pt. in bed eyes closed no abnormal movement noted.) Lips and Perioral Area: None, normal Jaw: None, normal Tongue: None, normal,Extremity Movements Upper (arms, wrists, hands, fingers): None, normal Lower (legs, knees, ankles, toes): None,  normal, Trunk Movements Neck, shoulders, hips: None, normal, Overall Severity Severity of abnormal movements (highest score from questions above): None, normal Incapacitation due to abnormal movements: None, normal Patient's awareness of abnormal movements (rate only patient's report): No Awareness, Dental Status Current problems with teeth and/or dentures?: No Does patient usually wear dentures?: No  CIWA:  CIWA-Ar Total: 0  COWS:     Treatment Plan Summary: Daily contact with patient to assess and evaluate symptoms and progress in treatment Medication management  Plan: Continue current plan of care. Plan for discharge.  Medical Decision Making Problem Points:  Established problem, stable/improving (1), Review of last therapy session (1) and Review of psycho-social stressors (1) Data Points:  Review of medication regiment & side effects (2)  I certify that inpatient services furnished can reasonably be expected to improve the patient's condition.   Christobal Morado 04/23/2012, 11:15 AM

## 2012-04-23 NOTE — Progress Notes (Signed)
[facility] Group Notes:  (Counselor/Nursing/MHT/Case Management/Adjunct)  04/23/2012 8:10PM  Type of Therapy:  Psychoeducational Skills  Participation Level:  Active  Participation Quality:  Appropriate  Affect:  Appropriate  Cognitive:  Appropriate and Disorganized  Insight:  Engaged  Engagement in Group:  Engaged  Engagement in Therapy:  Engaged  Modes of Intervention:  Wrap-Up Group  Summary of Progress/Problems: Pt said that she was happy to get a good meal at dinner time. Pt also said that she is happy to be getting a new dog when she discharges from [facility]. Pt said that she copes with the loss of her parents and brother by writing in her journal. Pt said that she writes goodbye letters to them. Pt said that she is thankful for the groups here at [facility]  Jaliah Foody K 04/23/2012, 9:39 PM

## 2012-04-23 NOTE — Progress Notes (Signed)
D:  Patient up and in the milieu today.  Has been incontinent of urine and stool.  Refused to answer self inventory questions.  Up walking with crutches.  Took medications this morning.  Denies suicidal ideation. A:  Assistance provided to patient for self care and diaper changing.  Encouraged group participation.  Medications given as ordered, however patient refused Miralax. R:  Mostly cooperative and attending groups.  Interacting well with peers.

## 2012-04-23 NOTE — Progress Notes (Signed)
Patient ID: Lori [name], female   DOB: 08-22-1970, 41 y.o.   MRN: [id number] D: pt. In bed eyes closed, resp even. Braces to legs, and WC at bedside. A: Staff will monitor q60min for safety. Writer will assess for s/s of distress. R: Pt. Is safe on the unit. Resp,.unlabored.

## 2012-04-23 NOTE — Progress Notes (Signed)
D: Pt has been wheeling herself up & down the hallway in a wheelchair. Pt. After 9 PM indicated that she needed to be changed. Pt was incontinent of both bowel & bladder. A: Pt was changed & pt. Requested to wear 2 pull-ups instead of one. WC was also cleaned & a new pad put on the chair.Pt. Supported & encouraged.Continues on 15 minute checks. R: pt safety maintained.

## 2012-04-23 NOTE — Progress Notes (Signed)
Reviewed

## 2012-04-23 NOTE — Social Work (Signed)
[facility] LCSW Aftercare Discharge Planning Group Note  04/23/2012  8:45 AM  Participation Quality:  Patient was only present for last 5 minutes of group  Affect:  Blunted  Cognitive:  Appropriate  Insight:  Supportive  Engagement in Group:  Supportive  Modes of Intervention:  Discussion, Education and Support  Summary of Progress/Problems: Pt attended discharge planning group and minimally participated in group.  CSW provided pt with today's workbook.  Patient joined group with only 5 minutes to go but was supportive of a peer and somewhat monopolizing.   [facility] LCSW Group Therapy  04/23/2012 1:15 PM  Type of Therapy:  Group Therapy    Patient did not attend group therapy  Patton Salles LCSW 04/23/2012 6:51 AM

## 2012-04-24 DIAGNOSIS — F333 Major depressive disorder, recurrent, severe with psychotic symptoms: Principal | ICD-10-CM

## 2012-04-24 DIAGNOSIS — F79 Unspecified intellectual disabilities: Secondary | ICD-10-CM

## 2012-04-24 DIAGNOSIS — R6250 Unspecified lack of expected normal physiological development in childhood: Secondary | ICD-10-CM | POA: Diagnosis present

## 2012-04-24 NOTE — Progress Notes (Signed)
Psychoeducational Group Note  Date:  04/24/2012 Time:  2000  Group Topic/Focus:  NA group  Participation Level:  Active  Participation Quality:  Appropriate  Affect:  Appropriate  Cognitive:  Appropriate  Insight:  Engaged  Engagement in Group:  Engaged  Additional Comments:    Lori [name] Patience 04/24/2012, 11:02 PM
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
## 2012-04-24 NOTE — Progress Notes (Signed)
Patient ID: Lori [name], female   DOB: 17-May-1970, 42 y.o.   MRN: [id number] D: On rounds, Pt. opened her eyes but said nothing. A: Pt. Lying on her bed on her  Lt. side with bilateral leg braces on, facing the door: in no apparent distress/ R: Will continue to monitor for changes.

## 2012-04-24 NOTE — Progress Notes (Signed)
Patient just presented with pseudo like seizure symptoms in the dining hall; patient was able to talk and answer all questions logically; patient was alert and oriented during this process; patient states this is the "biggest one" but her o2 sats were 100% on room air; her heart rate was in the 90s and lower 100s; and bp 120/72; patient was brought back to the unit and placed in bed

## 2012-04-24 NOTE — Progress Notes (Signed)
D: Patient denies SI/HI and A/V hallucinations; patient reports sleep to be well; reports appetite to be good ; reports energy level is high ; reports ability to pay attention to be good; rates depression as 0/10; rates hopelessness 0/10; rates anxiety as 0/10;   A: Monitored q 15 minutes; patient encouraged to attend groups; patient educated about medications; patient given medications per physician orders; patient encouraged to express feelings and/or concerns  R: Patient is getting into verbal altercations with the peers on the unit and patient deescalated but patient continues with several of the peers on the unit; patient is taking medications as prescribed and tolerating medications

## 2012-04-25 NOTE — Progress Notes (Signed)
Pt was moved to the [address] today because she was getting into verbal altercations with patients on the [address].  So far pt has been appropriate on the hall.  She uses her crutches very little and prefers to use the wheelchair to get around.  Pt makes her needs known to staff.  She denies SI/HI/AV to this Clinical research associate.  She attended evening group on the [address].  Pt wears depends and requires assistance with hygiene, although she is able to do much of it herself.  Support/encouragement given.  Safety maintained with q15 minute checks.

## 2012-04-25 NOTE — Progress Notes (Signed)
[facility] LCSW Group Therapy  04/25/2012 12:52 PM  Type of Therapy:  Group Therapy   Patient did not attend group therapy :  Lori [name] 04/25/2012, 12:52 PM

## 2012-04-25 NOTE — Progress Notes (Signed)
D - Patient cooperative with staff, mood depressed. Patient interacting with peers appropriately. Patient attended group karaoke tonight. Patient complains of anxiety and left knee pain. Patient positive for passive SI but verbally contracts for safety, denies HI or hallucinations.  A - Patient offered encouragement and support through therapeutic conversation. Encouraged patient to speak with staff about any concerns or questions. Medications given as ordered.  R - Patient safety maintained with Q 15 minute checks.

## 2012-04-25 NOTE — Progress Notes (Signed)
[facility] MD Progress Note  04/25/2012 12:22 PM Lori [name]  MRN:  [id number] Subjective:  Patient resting in bed. Endorsing suicidal thoughts, but able to contract for safety. Calmer on [address]. Diagnosis:   Axis I: Major Depression, Recurrent severe Axis II: Deferred Axis III:  Past Medical History  Diagnosis Date  . Spinal bifida, closed   . UTI (lower urinary tract infection)   . Seizures   . Cancer     colon  . Genital herpes   . Arthritis   . Hydrocephalus   . Psychoses    Axis IV: housing problems and other psychosocial or environmental problems Axis V: 41-50 serious symptoms  ADL's:  Impaired  Sleep: Fair  Appetite:  Fair   Psychiatric Specialty Exam: Review of Systems  Constitutional: Negative.   HENT: Negative.   Eyes: Negative.   Respiratory: Negative.   Cardiovascular: Negative.   Gastrointestinal: Negative.   Genitourinary: Negative.   Musculoskeletal: Positive for falls.  Skin: Negative.   Neurological: Negative.   Endo/Heme/Allergies: Negative.   Psychiatric/Behavioral: Positive for depression and suicidal ideas.    Blood pressure 117/77, pulse 101, temperature 98.2 F (36.8 C), temperature source Oral, resp. rate 16, SpO2 96.00%.There is no height or weight on file to calculate BMI.  General Appearance: Disheveled  Eye Contact::  Minimal  Speech:  Clear and Coherent  Volume:  Normal  Mood:  Anxious and Depressed  Affect:  Constricted  Thought Process:  Circumstantial  Orientation:  Full (Time, Place, and Person)  Thought Content:  Delusions and Rumination  Suicidal Thoughts:  Yes.  without intent/plan  Homicidal Thoughts:  No  Memory:  Immediate;   Fair Recent;   Fair Remote;   Fair  Judgement:  Impaired  Insight:  Shallow  Psychomotor Activity:  Decreased, in wheelchair  Concentration:  Fair  Recall:  Fair  Akathisia:  No  Handed:  Right  AIMS (if indicated):     Assets:  Communication Skills  Sleep:  Number of Hours: 3.75     Current Medications: Current Facility-Administered Medications  Medication Dose Route Frequency Provider Last Rate Last Dose  . acetaminophen (TYLENOL) tablet 650 mg  650 mg Oral Q6H PRN Verne Spurr, PA-C   650 mg at 04/11/12 2248  . alum & mag hydroxide-simeth (MAALOX/MYLANTA) 200-200-20 MG/5ML suspension 30 mL  30 mL Oral Q4H PRN Verne Spurr, PA-C   30 mL at 04/23/12 1144  . calamine lotion   Topical TID PRN Nelly Rout, MD      . chlorproMAZINE (THORAZINE) tablet 100 mg  100 mg Oral TID PRN Nelly Rout, MD   100 mg at 04/25/12 0804  . citalopram (CELEXA) tablet 40 mg  40 mg Oral QHS Nelly Rout, MD   40 mg at 04/24/12 2221  . feeding supplement (ENSURE COMPLETE) liquid 237 mL  237 mL Oral TID BM Ashley Jacobs, RD   237 mL at 04/24/12 2002  . loratadine (CLARITIN) tablet 10 mg  10 mg Oral Daily Nelly Rout, MD   10 mg at 04/25/12 0758  . lurasidone (LATUDA) tablet 80 mg  80 mg Oral QHS Yanuel Tagg, MD   80 mg at 04/24/12 2221  . magnesium hydroxide (MILK OF MAGNESIA) suspension 30 mL  30 mL Oral Daily PRN Verne Spurr, PA-C      . nicotine polacrilex (NICORETTE) gum 2 mg  2 mg Oral Q2H PRN [name] Adams, PA   2 mg at 04/24/12 0840  . oxyCODONE-acetaminophen (PERCOCET/ROXICET) 5-325 MG per  tablet 1 tablet  1 tablet Oral Q6H PRN Nelly Rout, MD   1 tablet at 04/23/12 2201  . pantoprazole (PROTONIX) EC tablet 20 mg  20 mg Oral Daily Nelly Rout, MD   20 mg at 04/25/12 0758  . PHENobarbital (LUMINAL) tablet 64.8 mg  64.8 mg Oral QHS Nelly Rout, MD   64.8 mg at 04/24/12 2220  . polyethylene glycol (MIRALAX / GLYCOLAX) packet 17 g  17 g Oral Daily Nelly Rout, MD   17 g at 04/21/12 0816  . selenium sulfide (SELSUN) 2.5 % shampoo   Topical Daily PRN Nelly Rout, MD      . simvastatin (ZOCOR) tablet 20 mg  20 mg Oral q1800 Nelly Rout, MD   20 mg at 04/24/12 1756    Lab Results: No results found for this or any previous visit (from the past 48  hour(s)).  Physical Findings: AIMS: Facial and Oral Movements Muscles of Facial Expression: None, normal Lips and Perioral Area: None, normal Jaw: None, normal Tongue: None, normal,Extremity Movements Upper (arms, wrists, hands, fingers): None, normal Lower (legs, knees, ankles, toes): None, normal, Trunk Movements Neck, shoulders, hips: None, normal, Overall Severity Severity of abnormal movements (highest score from questions above): None, normal Incapacitation due to abnormal movements: None, normal Patient's awareness of abnormal movements (rate only patient's report): No Awareness, Dental Status Current problems with teeth and/or dentures?: No Does patient usually wear dentures?: No  CIWA:  CIWA-Ar Total: 0  COWS:     Treatment Plan Summary: Daily contact with patient to assess and evaluate symptoms and progress in treatment Medication management  Plan: Continue current plan of care. Look into ALF options and plan for discharge once patient safe and stable.  Medical Decision Making Problem Points:  Established problem, stable/improving (1), Review of last therapy session (1) and Review of psycho-social stressors (1) Data Points:  Review of medication regiment & side effects (2)  I certify that inpatient services furnished can reasonably be expected to improve the patient's condition.   Dandrae Kustra 04/25/2012, 12:22 PM

## 2012-04-25 NOTE — Progress Notes (Signed)
D: Patient appropriate and cooperative with staff. Patient's mood is anxious. Patient complained of anxiety. She reported on self inventory sheet that her appetite is good, energy level is low, and ability to pay attention is good. Patient rated depression and feelings of hopelessness "10".  A: Support and encouragement provided to patient. Scheduled medications administered per MD orders. PRN Thorazine given for anxiety. Monitor Q15 minute checks for safety.  R: Patient receptive. Passive SI, but contracts for safety. Denies HI. Patient remains safe.

## 2012-04-25 NOTE — Progress Notes (Signed)
Patient ID: Lori [name], female   DOB: 1971/04/01, 42 y.o.   MRN: [id number] PER STATE REGULATIONS 482.30  THIS CHART WAS REVIEWED FOR MEDICAL NECESSITY WITH RESPECT TO THE PATIENT'S ADMISSION/ DURATION OF STAY.  NEXT REVIEW DATE: 04/29/2011  Willa Rough, RN, BSN CASE MANAGER

## 2012-04-25 NOTE — Clinical Social Work Note (Signed)
[facility] LCSW Aftercare Discharge Planning Group Note  04/25/2012 8:45 AM  Participation Quality:  Appropriate and Attentive  Affect:  Appropriate  Cognitive:  Alert and Appropriate  Insight:  Developing/Improving  Engagement in Group:  Developing/Improving  Modes of Intervention:  Clarification, Discussion, Education, Exploration, Orientation, Problem-solving, Rapport Building, Socialization and Support  Summary of Progress/Problems: Pt attended discharge planning group and actively participated in group.  CSW provided pt with today's workbook.  Pt presents with calm mood and affect.  Pt rates depression and anxiety at a 10 today.  Pt endorses SI but contracts for safety.  When asked what her plan was pt states that she plans to get into an assisted living facility closer to her girlfriend who resides in [place].  Pt states that they then plan to have a wedding and live together.  Pt states that they plan to adopt 6 kids from [place].  CSW will attempt to reach pt's guardian again to inquire about placement.  No further needs voiced by pt at this time.    Reyes Ivan, LCSWA 04/25/2012 10:20 AM

## 2012-04-26 NOTE — Clinical Social Work Note (Signed)
[facility] LCSW Group Therapy  04/26/2012  1:15 PM   Type of Therapy:  Group Therapy  Participation Level:  Minimal  Participation Quality:  Inattentive  Affect:  Flat  Cognitive:  Lacking  Insight:  Lacking  Engagement in Therapy:  Limited  Modes of Intervention:  Clarification, Confrontation, Discussion, Exploration, Limit-setting, Problem-solving, Socialization and Support  Summary of Progress/Problems:The topic for today was feelings about relapse.  Pt sat through group for about 20 minutes before saying she was having a seizure.  Pt was taken out of group by the nurse.    Reyes Ivan, LCSWA 04/26/2012 2:07 PM

## 2012-04-26 NOTE — Progress Notes (Signed)
D: Patient denies SI/HI and visual hallucinations and admits to auditory hallucinations of her mother telling her she wants her to die;rates depression as 8/10; rates hopelessness 5/10; rates anxiety as 10/10;   A: Monitored q 15 minutes; patient encouraged to attend groups; patient educated about medications; patient given medications per physician orders; patient encouraged to express feelings and/or concerns  R: Patient is attention seeking and most times cooperative; patient's interaction with staff and peers is sometimes inappropriate;  patient is taking medications as prescribed and tolerating medications; patient is attending all groups

## 2012-04-26 NOTE — Progress Notes (Signed)
Psychoeducational Group Note  Date:  04/26/2012 Time:  1000  Group Topic/Focus:  Stages of Change:   The focus of this group is to explain the stages of change and help patients identify changes they want to make upon discharge.  Participation Level:  Active  Participation Quality:  Appropriate, Attentive and Sharing  Affect:  Appropriate  Cognitive:  Alert and Appropriate  Insight:  Engaged  Engagement in Group:  Engaged  Additional Comments:  Pt stated that she has been in stages precontemplation, and contemplation. She feels that she is currently in the preparation stage.  Sharyn Lull 04/26/2012, 10:55 AM

## 2012-04-26 NOTE — Clinical Social Work Note (Signed)
CSW contacted [name], pt's guardian.  [name] states that she was unable to find placement for pt.  [name] explained that she doesn't know of any facilities that will take pt back.  [name] states that her plan is to bring pt back to [facility] where they will be able to monitor and assist her better.  [name] requested that CSW send out the [facility] in [facility] area.  CSW completed FL2 and sent it out on this date.  CSW informed [name] that we were looking at discharging pt mid next week.

## 2012-04-26 NOTE — Clinical Social Work Note (Signed)
[facility] LCSW Aftercare Discharge Planning Group Note  04/26/2012 8:45 AM  Participation Quality:  Did Not Attend group   Lori [name], LCSWA 04/26/2012 9:42 AM   

## 2012-04-26 NOTE — Progress Notes (Signed)
[facility] INPATIENT:  Family/Significant Other Suicide Prevention Education  Suicide Prevention Education:  Education Completed; Lori [name] - guardian [phone]),  (name of family member/significant other) has been identified by the patient as the family member/significant other with whom the patient will be residing, and identified as the person(s) who will aid the patient in the event of a mental health crisis (suicidal ideations/suicide attempt).  With written consent from the patient, the family member/significant other has been provided the following suicide prevention education, prior to the and/or following the discharge of the patient.  The suicide prevention education provided includes the following:  Suicide risk factors  Suicide prevention and interventions  National Suicide Hotline telephone number  [facility] assessment telephone number  [facility] Emergency Assistance 911  [facility] and/or Residential Mobile Crisis Unit telephone number  Request made of family/significant other to:  Remove weapons (e.g., guns, rifles, knives), all items previously/currently identified as safety concern.    Remove drugs/medications (over-the-counter, prescriptions, illicit drugs), all items previously/currently identified as a safety concern.  The family member/significant other verbalizes understanding of the suicide prevention education information provided.  The family member/significant other agrees to remove the items of safety concern listed above.  Lori [name] 04/26/2012, 12:48 PM

## 2012-04-26 NOTE — Tx Team (Addendum)
Interdisciplinary Treatment Plan Update (Adult)  Date:  04/26/2012  Time Reviewed:  9:48 AM   Progress in Treatment: Attending groups: Yes Participating in groups:  Yes Taking medication as prescribed: Yes Tolerating medication:  Yes Family/Significant othe contact made:  CSW assessing for appropriate contact Patient understands diagnosis:  Yes Discussing patient identified problems/goals with staff:  Yes Medical problems stabilized or resolved:  Yes Denies suicidal/homicidal ideation: Yes Issues/concerns per patient self-inventory:  None identified Other: N/A  New problem(s) identified: None Identified  Reason for Continuation of Hospitalization: Anxiety Depression Medication stabilization  Interventions implemented related to continuation of hospitalization: mood stabilization, medication monitoring and adjustment, group therapy and psycho education, suicide risk assessment, collateral contact, aftercare planning, ongoing physician assessments and safety checks q 15 mins  Additional comments: N/A  Estimated length of stay: 3-5 days  Discharge Plan: CSW is assessing for appropriate referrals.    New goal(s): N/A  Review of initial/current patient goals per problem list:    1.  Goal(s): Stabilize on meds  Met:  No  Target date: 4 days  As evidenced by: pt's mood improving.    2.  Goal (s): Reduce depressive symptoms from a 10 to a 3  Met:  No  Target date: 3-5 days  As evidenced by: Pt rates at a 10 today.    3.  Goal (s): Reduce anxiety symptoms from a 10 to a 3  Met:  No  Target date:  3-5 days  As evidenced by: Pt rates at a 10 today.    4.  Goal(s): Eliminate SI  Met:  No  Target date: 3-5 days  As evidenced by: pt denying SI.  Pt continues to endorse SI.   Attendees: Patient:     Family:     Physician: Geoffery Lyons, MD 04/26/2012 9:48 AM   Nursing: Seabron Spates, RN 04/26/2012 9:48 AM   Clinical Social Worker:  Reyes Ivan, LCSWA 04/26/2012   9:48 AM   Other: Serena Colonel, NP 04/26/2012  9:48 AM   Other:     Other:     Other:     Other:      Scribe for Treatment Team:   Reyes Ivan 04/26/2012 9:48 AM

## 2012-04-26 NOTE — Progress Notes (Signed)
[facility] MD Progress Note  04/26/2012 12:20 PM Lori [name]  MRN:  [id number] Subjective:  Patient has been complaining of chest pain, seizure like symptoms over last night. However she is doing well this morning. Endorsing depressed mood. Diagnosis:   Axis I: Depressive Disorder NOS Axis II: Deferred Axis III:  Past Medical History  Diagnosis Date  . Spinal bifida, closed   . UTI (lower urinary tract infection)   . Seizures   . Cancer     colon  . Genital herpes   . Arthritis   . Hydrocephalus   . Psychoses    Axis IV: housing problems Axis V: 51-60 moderate symptoms  ADL's:  Impaired  Sleep: Fair  Appetite:  Fair    Psychiatric Specialty Exam: Review of Systems  Constitutional: Negative.   HENT: Negative.   Eyes: Negative.   Respiratory: Negative.   Cardiovascular: Negative.   Gastrointestinal: Negative.   Genitourinary: Negative.   Musculoskeletal: Negative.   Skin: Negative.   Neurological: Negative.        Spina bifida  Endo/Heme/Allergies: Negative.   Psychiatric/Behavioral: Positive for depression and suicidal ideas.    Blood pressure 117/75, pulse 98, temperature 98.5 F (36.9 C), temperature source Oral, resp. rate 16, SpO2 96.00%.There is no height or weight on file to calculate BMI.  General Appearance: Disheveled  Eye Solicitor::  Fair  Speech:  Clear and Coherent  Volume:  Normal  Mood:  Anxious and Depressed  Affect:  Constricted  Thought Process:  Circumstantial  Orientation:  Full (Time, Place, and Person)  Thought Content:  WDL  Suicidal Thoughts:  Yes.  without intent/plan  Homicidal Thoughts:  No  Memory:  Immediate;   Fair Recent;   Fair Remote;   Fair  Judgement:  Impaired  Insight:  Shallow  Psychomotor Activity:  Decreased  Concentration:  Fair  Recall:  Fair  Akathisia:  No  Handed:  Right  AIMS (if indicated):     Assets:  Communication Skills Desire for Improvement  Sleep:  Number of Hours: 4    Current  Medications: Current Facility-Administered Medications  Medication Dose Route Frequency Provider Last Rate Last Dose  . acetaminophen (TYLENOL) tablet 650 mg  650 mg Oral Q6H PRN Verne Spurr, PA-C   650 mg at 04/11/12 2248  . alum & mag hydroxide-simeth (MAALOX/MYLANTA) 200-200-20 MG/5ML suspension 30 mL  30 mL Oral Q4H PRN Verne Spurr, PA-C   30 mL at 04/23/12 1144  . calamine lotion   Topical TID PRN Nelly Rout, MD      . chlorproMAZINE (THORAZINE) tablet 100 mg  100 mg Oral TID PRN Nelly Rout, MD   100 mg at 04/25/12 0804  . citalopram (CELEXA) tablet 40 mg  40 mg Oral QHS Nelly Rout, MD   40 mg at 04/25/12 2156  . feeding supplement (ENSURE COMPLETE) liquid 237 mL  237 mL Oral TID BM Ashley Jacobs, RD   237 mL at 04/26/12 1000  . loratadine (CLARITIN) tablet 10 mg  10 mg Oral Daily Nelly Rout, MD   10 mg at 04/26/12 0750  . lurasidone (LATUDA) tablet 80 mg  80 mg Oral QHS Rigoberto Repass, MD   80 mg at 04/25/12 2156  . magnesium hydroxide (MILK OF MAGNESIA) suspension 30 mL  30 mL Oral Daily PRN Verne Spurr, PA-C      . nicotine polacrilex (NICORETTE) gum 2 mg  2 mg Oral Q2H PRN [name] Adams, PA   2 mg at 04/26/12 0650  .  oxyCODONE-acetaminophen (PERCOCET/ROXICET) 5-325 MG per tablet 1 tablet  1 tablet Oral Q6H PRN Nelly Rout, MD   1 tablet at 04/25/12 2158  . pantoprazole (PROTONIX) EC tablet 20 mg  20 mg Oral Daily Nelly Rout, MD   20 mg at 04/26/12 0749  . PHENobarbital (LUMINAL) tablet 64.8 mg  64.8 mg Oral QHS Nelly Rout, MD   64.8 mg at 04/25/12 2156  . polyethylene glycol (MIRALAX / GLYCOLAX) packet 17 g  17 g Oral Daily Nelly Rout, MD   17 g at 04/21/12 0816  . selenium sulfide (SELSUN) 2.5 % shampoo   Topical Daily PRN Nelly Rout, MD      . simvastatin (ZOCOR) tablet 20 mg  20 mg Oral q1800 Nelly Rout, MD   20 mg at 04/25/12 1849    Lab Results: No results found for this or any previous visit (from the past 48 hour(s)).  Physical  Findings: AIMS: Facial and Oral Movements Muscles of Facial Expression: None, normal Lips and Perioral Area: None, normal Jaw: None, normal Tongue: None, normal,Extremity Movements Upper (arms, wrists, hands, fingers): None, normal Lower (legs, knees, ankles, toes): None, normal, Trunk Movements Neck, shoulders, hips: None, normal, Overall Severity Severity of abnormal movements (highest score from questions above): None, normal Incapacitation due to abnormal movements: None, normal Patient's awareness of abnormal movements (rate only patient's report): No Awareness, Dental Status Current problems with teeth and/or dentures?: No Does patient usually wear dentures?: No  CIWA:  CIWA-Ar Total: 0  COWS:     Treatment Plan Summary: Daily contact with patient to assess and evaluate symptoms and progress in treatment Medication management  Plan: Continue current plan of care. Discussed with patient all the group homes she has been at. She reports being in a different group home every 6 months for past 2 years. States [facility] was the best among those. Will contact this group home and start planning discharge.  Medical Decision Making Problem Points:  Established problem, stable/improving (1), Review of last therapy session (1) and Review of psycho-social stressors (1) Data Points:  Review of medication regiment & side effects (2)  I certify that inpatient services furnished can reasonably be expected to improve the patient's condition.   Tavarious Freel 04/26/2012, 12:20 PM

## 2012-04-27 MED ORDER — TRAZODONE HCL 50 MG PO TABS
50.0000 mg | ORAL_TABLET | Freq: Every evening | ORAL | Status: DC | PRN
  Administered 2012-04-28 – 2012-05-01 (×5): 50 mg via ORAL
  Filled 2012-04-27 (×3): qty 1
  Filled 2012-04-27: qty 8
  Filled 2012-04-27 (×2): qty 1

## 2012-04-27 MED ORDER — ENSURE COMPLETE PO LIQD
237.0000 mL | Freq: Three times a day (TID) | ORAL | Status: DC | PRN
  Administered 2012-04-28 – 2012-05-02 (×7): 237 mL via ORAL

## 2012-04-27 MED ORDER — TRAZODONE HCL 50 MG PO TABS
50.0000 mg | ORAL_TABLET | Freq: Once | ORAL | Status: AC
  Administered 2012-04-27: 50 mg via ORAL
  Filled 2012-04-27 (×2): qty 1

## 2012-04-27 NOTE — Progress Notes (Signed)
[facility] MD Progress Note  04/27/2012 12:14 PM Lori [name]  MRN:  [id number] Subjective: Today is confabulating about being an alcoholic. Was moved to [facility] ward yesterday after an episode with another patient. Has had changes in placement every 6 months the past 2 years due to her behaviors.She would like to go back to [facility] and contact has been initiated with this group home.   Diagnosis:   Axis I: Depressive Disorder NOS Axis II: Deferred Axis III:  Past Medical History  Diagnosis Date  . Spinal bifida, closed   . UTI (lower urinary tract infection)   . Seizures   . Cancer     colon  . Genital herpes   . Arthritis   . Hydrocephalus   . Psychoses    Axis IV: housing problems Axis V: 51-60 moderate symptoms  ADL's:  Impaired  Sleep: Fair  Appetite:  Fair    Psychiatric Specialty Exam: Review of Systems  Constitutional: Negative.   HENT: Negative.   Eyes: Negative.   Respiratory: Negative.   Cardiovascular: Negative.   Gastrointestinal: Negative.   Genitourinary: Negative.   Musculoskeletal: Negative.   Skin: Negative.   Neurological: Negative.        Spina bifida  Endo/Heme/Allergies: Negative.   Psychiatric/Behavioral: Positive for depression and suicidal ideas.    Blood pressure 123/69, pulse 109, temperature 98.5 F (36.9 C), temperature source Oral, resp. rate 16, SpO2 96.00%.There is no height or weight on file to calculate BMI.  General Appearance: Disheveled  Eye Solicitor::  Fair  Speech:  Clear and Coherent  Volume:  Normal  Mood:  Anxious and Depressed  Affect:  Constricted  Thought Process:  Circumstantial  Orientation:  Full (Time, Place, and Person)  Thought Content:  WDL  Suicidal Thoughts:  Yes.  without intent/plan  Homicidal Thoughts:  No  Memory:  Immediate;   Fair Recent;   Fair Remote;   Fair  Judgement:  Impaired  Insight:  Shallow  Psychomotor Activity:  Decreased  Concentration:  Fair  Recall:  Fair  Akathisia:  No    Handed:  Right  AIMS (if indicated):     Assets:  Communication Skills Desire for Improvement  Sleep:  Number of Hours: 2.75    Current Medications: Current Facility-Administered Medications  Medication Dose Route Frequency Provider Last Rate Last Dose  . acetaminophen (TYLENOL) tablet 650 mg  650 mg Oral Q6H PRN Verne Spurr, PA-C   650 mg at 04/11/12 2248  . alum & mag hydroxide-simeth (MAALOX/MYLANTA) 200-200-20 MG/5ML suspension 30 mL  30 mL Oral Q4H PRN Verne Spurr, PA-C   30 mL at 04/23/12 1144  . calamine lotion   Topical TID PRN Nelly Rout, MD      . chlorproMAZINE (THORAZINE) tablet 100 mg  100 mg Oral TID PRN Nelly Rout, MD   100 mg at 04/26/12 1354  . citalopram (CELEXA) tablet 40 mg  40 mg Oral QHS Nelly Rout, MD   40 mg at 04/26/12 2103  . feeding supplement (ENSURE COMPLETE) liquid 237 mL  237 mL Oral TID PRN [name] Abdulahi Schor, PA      . loratadine (CLARITIN) tablet 10 mg  10 mg Oral Daily Nelly Rout, MD   10 mg at 04/27/12 0854  . lurasidone (LATUDA) tablet 80 mg  80 mg Oral QHS Himabindu Ravi, MD   80 mg at 04/26/12 2103  . magnesium hydroxide (MILK OF MAGNESIA) suspension 30 mL  30 mL Oral Daily PRN Verne Spurr, PA-C      .  nicotine polacrilex (NICORETTE) gum 2 mg  2 mg Oral Q2H PRN [name] Teruko Joswick, PA   2 mg at 04/26/12 1939  . oxyCODONE-acetaminophen (PERCOCET/ROXICET) 5-325 MG per tablet 1 tablet  1 tablet Oral Q6H PRN Nelly Rout, MD   1 tablet at 04/27/12 1112  . pantoprazole (PROTONIX) EC tablet 20 mg  20 mg Oral Daily Nelly Rout, MD   20 mg at 04/27/12 0854  . PHENobarbital (LUMINAL) tablet 64.8 mg  64.8 mg Oral QHS Nelly Rout, MD   64.8 mg at 04/26/12 2106  . polyethylene glycol (MIRALAX / GLYCOLAX) packet 17 g  17 g Oral Daily Nelly Rout, MD   17 g at 04/27/12 0854  . selenium sulfide (SELSUN) 2.5 % shampoo   Topical Daily PRN Nelly Rout, MD      . simvastatin (ZOCOR) tablet 20 mg  20 mg Oral q1800 Nelly Rout, MD   20 mg at 04/26/12  1720    Lab Results: No results found for this or any previous visit (from the past 48 hour(s)).  Physical Findings: AIMS: Facial and Oral Movements Muscles of Facial Expression: None, normal Lips and Perioral Area: None, normal Jaw: None, normal Tongue: None, normal,Extremity Movements Upper (arms, wrists, hands, fingers): None, normal Lower (legs, knees, ankles, toes): None, normal, Trunk Movements Neck, shoulders, hips: None, normal, Overall Severity Severity of abnormal movements (highest score from questions above): None, normal Incapacitation due to abnormal movements: None, normal Patient's awareness of abnormal movements (rate only patient's report): No Awareness, Dental Status Current problems with teeth and/or dentures?: No Does patient usually wear dentures?: No  CIWA:  CIWA-Ar Total: 0  COWS:     Treatment Plan Summary: Daily contact with patient to assess and evaluate symptoms and progress in treatment Medication management  Plan: Continue current plan of care. Discussed with patient all the group homes she has been at. She reports being in a different group home every 6 months for past 2 years. States [facility] was the best among those. Will contact this group home and start planning discharge.  Medical Decision Making Problem Points:  Established problem, stable/improving (1), Review of last therapy session (1) and Review of psycho-social stressors (1) Data Points:  Review of medication regiment & side effects (2)  I certify that inpatient services furnished can reasonably be expected to improve the patient's condition.   Emerson Schreifels,[name]  RPA-C CAQ-Psych  04/27/2012, 12:14 PM
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
## 2012-04-27 NOTE — Progress Notes (Signed)
D) Patient pleasant and cooperative upon my assessment. Patient rates depression as 0/10, patient rates hopeless feelings as 0/10. Patient denies SI but states she is having some HI towards another patient on the unit. Pt denies A/V hallucinations. Patient refused to attend night group tonight pt stated "I was looking forward to AA". A) Patient offered support and encouragement, patient encouraged to discuss feelings/concerns with staff. Patient verbalized understanding. Patient monitored Q15 minutes for safety.  R) Patient active on unit. Patient taking medications as ordered. Will continue to monitor.

## 2012-04-27 NOTE — Progress Notes (Signed)
D. Pt pleasant on approach, attended AA group this evening.  Pt states that she must have Trazodone to be able to sleep at night and requests that it be ordered.  She received it last night but it was given as a one time dose.  Pt also requested Percocet for left knee pain.  Denies SI/HI/hallucinations at this time.  A.  Support and encouragement offered.  Doctor Dan Humphreys notified of Trazodone request and orders received.  R.  Pt pleased with new order, presently in bed awake resting.  No distress noted. Will continue to monitor.

## 2012-04-27 NOTE — Progress Notes (Signed)
Patient did attend the first half of the evening speaker AA meeting. Pt reported that the meeting got too intense for her and rolled the hall in her wheelchair.

## 2012-04-27 NOTE — Progress Notes (Signed)
Psychoeducational Group Note  Date:  04/27/2012 Time:  0900  Group Topic/Focus:  Self Inventory Review  Participation Level:  Active  Participation Quality:  Attentive, Monopolizing, Redirectable, Sharing and Supportive  Affect:  Appropriate  Cognitive:  Alert and Appropriate  Insight:  Improving  Engagement in Group:  Engaged  Additional Comments:  Pt is needy and intrusive yet polite and redirectable.  Tawyna Pellot Shari Prows 04/27/2012, 10:13 AM

## 2012-04-27 NOTE — Progress Notes (Signed)
[facility] Group Notes:  (Counselor/Nursing/MHT/Case Management/Adjunct)  04/27/2012 5:40 PM  Type of Therapy:  Psychoeducational Skills  Participation Level:  Active  Participation Quality:  Intrusive and Redirectable  Affect:  Appropriate  Cognitive:  Alert, Appropriate and Oriented  Insight:  Limited  Engagement in Group:  Engaged  Engagement in Therapy:  Improving  Modes of Intervention:  Activity, Discussion, Problem-solving, Role-play, Socialization and Support   Summary of Progress/Problems: Pt attended afternoon Psychoeducational skills group. Pt and peers discussed the importance of using communication as an important way to cope with stress and allow others ways to intervene in difficult situations. Pt had difficulty reading and writing during the group activity, but did make a concerned effort to participate. Pt stated that it is important to communicate with others to relieve the stresses she deals with every day.   Lori [name] 04/27/2012, 5:40 PM

## 2012-04-27 NOTE — Progress Notes (Signed)
Psychoeducational Group Note  Date:  04/27/2012 Time:  2000  Group Topic/Focus:  Wrap-Up Group:   The focus of this group is to help patients review their daily goal of treatment and discuss progress on daily workbooks.  Participation Level:  Did Not Attend  Participation Quality:    Affect:    Cognitive:    Insight:    Engagement in Group:    Additional Comments:  Pt didn't attend wrap-up group this evening.   Lance Galas A 04/27/2012, 1:53 AM

## 2012-04-27 NOTE — Progress Notes (Signed)
Psychoeducational Group Note  Date:  04/27/2012 Time:  1300  Group Topic/Focus:  Making Healthy Choices:   The focus of this group is to help patients identify negative/unhealthy choices they were using prior to admission and identify positive/healthier coping strategies to replace them upon discharge.  Participation Level:  Active  Participation Quality:  Monopolizing  Affect:  Anxious  Cognitive:  Alert  Insight:  Off Topic  Engagement in Group:  Distracting  Additional Comments:   Cresenciano Lick 04/27/2012, 1:36 PM

## 2012-04-27 NOTE — Progress Notes (Signed)
D- Patient is out on unit interacting with peers. Monopolyzing groups and inappropriate comments redirected.  C/O rash on left breast and flank that she desribes as "burning". Mild white,small raised area assessed. Reported to PA. Rates depression and hopelessness at 7 and denies SI.  Reports poor sleep and good appetite. Requested prn pain medication for left knee pain. A- Support and encouragement given. Continue current POC and evaluation of treatment goals.  Continue 15' checks for safety.  R- Remains safe.

## 2012-04-28 NOTE — Progress Notes (Signed)
Psychoeducational Group Note  Date:  04/28/2012 Time:  0900  Group Topic/Focus:  Self Inventory Review  Participation Level:  Active  Participation Quality:  Attentive, Monopolizing and Redirectable  Affect:  Anxious and Irritable  Cognitive:  Appropriate  Insight:  Improving  Engagement in Group:  Engaged  Additional Comments:  Pt concerned with knee pain and swelling  Aryiana Klinkner Shari Prows 04/28/2012, 10:36 AM

## 2012-04-28 NOTE — Progress Notes (Signed)
Patient did attend the evening speaker AA meeting.  

## 2012-04-28 NOTE — Progress Notes (Signed)
Patient ID: Lori [name], female   DOB: 05/07/1970, 42 y.o.   MRN: [id number]  D: Patient labile and with flat affect during assessment. Pt isolative, endorsing depression. A: Monitor patient Q 15 minutes for safety, encourage group attendance, redirect behaviors as needed. Administer medications as ordered by MD. R: Patient denies SI, but remains withdrawn and labile with staff.
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
## 2012-04-28 NOTE — Progress Notes (Signed)
[facility] MD Progress Note  04/28/2012 9:54 AM Lori [name]  MRN:  [id number] Subjective: Says her meds are fine and asks if she has been accepted to [facility]. Assured her we would do our best to get an answer tomorrow. She is trying to be more appropriate about toiletting and ADL's.  Diagnosis:   Axis I: Depressive Disorder NOS Axis II: Deferred Axis III:  Past Medical History  Diagnosis Date  . Spinal bifida, closed   . UTI (lower urinary tract infection)   . Seizures   . Cancer     colon  . Genital herpes   . Arthritis   . Hydrocephalus   . Psychoses    Axis IV: housing problems Axis V: 51-60 moderate symptoms  ADL's:  Impaired  Sleep: Fair  Appetite:  Fair    Psychiatric Specialty Exam: Review of Systems  Constitutional: Negative.   HENT: Negative.   Eyes: Negative.   Respiratory: Negative.   Cardiovascular: Negative.   Gastrointestinal: Negative.   Genitourinary: Negative.   Musculoskeletal: Negative.   Skin: Negative.   Neurological: Negative.        Spina bifida  Endo/Heme/Allergies: Negative.   Psychiatric/Behavioral: Positive for depression and suicidal ideas.    Blood pressure 117/78, pulse 103, temperature 98.3 F (36.8 C), temperature source Oral, resp. rate 20, SpO2 96.00%.There is no height or weight on file to calculate BMI.  General Appearance: clean but wears hospital gowns   Eye Contact: good   Speech:  Clear and Coherent  Volume:  Normal  Mood:  Anxious and Depressed  Affect:  Full range   Thought Process:  Circumstantial  Orientation:  Full (Time, Place, and Person)  Thought Content:  WDL  Suicidal Thoughts:  None at this time   Homicidal Thoughts:  No  Memory:  Immediate;   Fair Recent;   Fair Remote;   Fair  Judgement:  Impaired  Insight:  Shallow  Psychomotor Activity:  Decreased  Concentration:  Fair  Recall:  Fair  Akathisia:  No  Handed:  Right  AIMS (if indicated):     Assets:  Communication  Skills Desire for Improvement  Sleep:  Number of Hours: 6.25    Current Medications: Current Facility-Administered Medications  Medication Dose Route Frequency Provider Last Rate Last Dose  . acetaminophen (TYLENOL) tablet 650 mg  650 mg Oral Q6H PRN Verne Spurr, PA-C   650 mg at 04/11/12 2248  . alum & mag hydroxide-simeth (MAALOX/MYLANTA) 200-200-20 MG/5ML suspension 30 mL  30 mL Oral Q4H PRN Verne Spurr, PA-C   30 mL at 04/23/12 1144  . calamine lotion   Topical TID PRN Nelly Rout, MD      . chlorproMAZINE (THORAZINE) tablet 100 mg  100 mg Oral TID PRN Nelly Rout, MD   100 mg at 04/26/12 1354  . citalopram (CELEXA) tablet 40 mg  40 mg Oral QHS Nelly Rout, MD   40 mg at 04/27/12 2217  . feeding supplement (ENSURE COMPLETE) liquid 237 mL  237 mL Oral TID PRN [name] Lorie Melichar, PA   237 mL at 04/28/12 0831  . loratadine (CLARITIN) tablet 10 mg  10 mg Oral Daily Nelly Rout, MD   10 mg at 04/28/12 0831  . lurasidone (LATUDA) tablet 80 mg  80 mg Oral QHS Himabindu Ravi, MD   80 mg at 04/27/12 2216  . magnesium hydroxide (MILK OF MAGNESIA) suspension 30 mL  30 mL Oral Daily PRN Verne Spurr, PA-C      .  nicotine polacrilex (NICORETTE) gum 2 mg  2 mg Oral Q2H PRN [name] Bijan Ridgley, PA   2 mg at 04/27/12 1445  . oxyCODONE-acetaminophen (PERCOCET/ROXICET) 5-325 MG per tablet 1 tablet  1 tablet Oral Q6H PRN Nelly Rout, MD   1 tablet at 04/28/12 0836  . pantoprazole (PROTONIX) EC tablet 20 mg  20 mg Oral Daily Nelly Rout, MD   20 mg at 04/28/12 0831  . PHENobarbital (LUMINAL) tablet 64.8 mg  64.8 mg Oral QHS Nelly Rout, MD   64.8 mg at 04/27/12 2217  . polyethylene glycol (MIRALAX / GLYCOLAX) packet 17 g  17 g Oral Daily Nelly Rout, MD   17 g at 04/27/12 0854  . selenium sulfide (SELSUN) 2.5 % shampoo   Topical Daily PRN Nelly Rout, MD      . simvastatin (ZOCOR) tablet 20 mg  20 mg Oral q1800 Nelly Rout, MD   20 mg at 04/27/12 1723  . traZODone (DESYREL) tablet 50 mg  50  mg Oral QHS PRN,MR X 1 Mike Craze, MD        Lab Results: No results found for this or any previous visit (from the past 48 hour(s)).  Physical Findings: AIMS: Facial and Oral Movements Muscles of Facial Expression: None, normal Lips and Perioral Area: None, normal Jaw: None, normal Tongue: None, normal,Extremity Movements Upper (arms, wrists, hands, fingers): None, normal Lower (legs, knees, ankles, toes): None, normal, Trunk Movements Neck, shoulders, hips: None, normal, Overall Severity Severity of abnormal movements (highest score from questions above): None, normal Incapacitation due to abnormal movements: None, normal Patient's awareness of abnormal movements (rate only patient's report): No Awareness, Dental Status Current problems with teeth and/or dentures?: No Does patient usually wear dentures?: No  CIWA:  CIWA-Ar Total: 0  COWS:     Treatment Plan Summary: Daily contact with patient to assess and evaluate symptoms and progress in treatment Medication management  Plan: Continue current plan of care. Discussed with patient all the group homes she has been at. She reports being in a different group home every 6 months for past 2 years. States [facility] was the best among those. Will contact this group home and start planning discharge.  Medical Decision Making Problem Points:  Established problem, stable/improving (1), Review of last therapy session (1) and Review of psycho-social stressors (1) Data Points:  Review of medication regiment & side effects (2)  I certify that inpatient services furnished can reasonably be expected to improve the patient's condition.   Calen Geister,[name]  RPA-C CAQ-Psych  04/28/2012, 9:54 AM

## 2012-04-28 NOTE — Clinical Social Work Psychosocial (Signed)
[facility] Group Notes:  (Clinical Social Work)  04/28/2012  10:00-11:00AM  Summary of Progress/Problems:   The main focus of today's process group was for the patient to define "support" and describe what healthy supports are, then to identify the patient's current support system and decide on other supports that can be put in place to prevent future hospitalizations.   An emphasis was placed on using therapist, doctor and problem-specific support groups to expand supports.  The patient expressed that she has a girlfriend and dog, niece, nephew and sister who are supportive.  As the discussion went on, she spoke up frequently and said she needed whatever was being discussed at that point, which became out of the realm of the possible after a while.  For instance, there were 9-10 different types of groups she wanted referrals to.  Type of Therapy:  Process Group  Participation Level:  Active  Participation Quality:  Attentive, Monopolizing, Redirectable and Sharing  Affect:  Appropriate and Blunted  Cognitive:  Appropriate  Insight:  Limited  Engagement in Therapy:  Engaged  Modes of Intervention:  Clarification, Education, Limit-setting, Problem-solving, Socialization, Support and Processing, Exploration, Discussion, Role-Play   Ambrose Mantle, LCSW 04/28/2012, 12:40 PM

## 2012-04-28 NOTE — Progress Notes (Signed)
Agree 

## 2012-04-28 NOTE — Progress Notes (Signed)
Psychoeducational Group Note  Date:  04/28/2012 Time:  1515  Group Topic/Focus:  Conflict Resolution:   The focus of this group is to discuss the conflict resolution process and how it may be used upon discharge.  Participation Level:  Did Not Attend  Participation Quality:    Affect:    Cognitive:    Insight:    Engagement in Group:    Additional Comments:  Pt was in the medication room, refused to attend.  Isla Pence M 04/28/2012, 5:06 PM

## 2012-04-29 MED ORDER — GABAPENTIN 100 MG PO CAPS
100.0000 mg | ORAL_CAPSULE | Freq: Two times a day (BID) | ORAL | Status: DC
  Administered 2012-04-29 – 2012-05-02 (×6): 100 mg via ORAL
  Filled 2012-04-29 (×9): qty 1

## 2012-04-29 NOTE — Clinical Social Work Note (Signed)
CSW had two telephone conversations with patient's guardian [name] at [phone]. Guardian states patient came from [facility] and has been in several facilities which she cannot return to. Patient had mentioned wishing to return to [facility] in [place] earlier today yet guardian does not believe she has adequate supervision there. Guardian requested FL2 done by C.Hodnett on Friday 1/3 be faxed to her at [phone]  Guardian will be here on Wednesday 05/02/11 around lunch time and it is hoped patient, CSW, psychiatrist and or PA can sit down with guardian at that time.  Carney Bern, LCSWA Clinical Social Worker [phone]

## 2012-04-29 NOTE — Progress Notes (Signed)
Patient ID: Lori [name], female   DOB: [birth year], 42 y.o.   MRN: [id number] PER STATE REGULATIONS 482.30  THIS CHART WAS REVIEWED FOR MEDICAL NECESSITY WITH RESPECT TO THE PATIENT'S ADMISSION/ DURATION OF STAY.  NEXT REVIEW DATE: 05/01/2012  Willa Rough, RN, BSN CASE MANAGER

## 2012-04-29 NOTE — Clinical Social Work Note (Signed)
[facility] LCSW Group Therapy   Type of Therapy:  Group Therapy  Participation Level:  Active  Participation Quality:  Intrusive and Sharing  Affect:  Blunted  Cognitive:  Alert  Insight:  Lacking  Engagement in Therapy:  Lacking  Modes of Intervention:  Limit-setting, Orientation and Support  Summary of Progress/Problems:   Group discussion focused on what patient's see as their own obstacles to recovery.  Patient shared intrusively. Lori [name] did not seem to like having wheelchair moved by facilitator to become a part of the group circle verses sitting in wheelchair in center of group.    Lori [name] 04/29/2012 10:02 PM

## 2012-04-29 NOTE — Progress Notes (Signed)
Psychoeducational Group Note  Date:  04/29/2012 Time: 1100    Group Topic/Focus:  Wellness Toolbox:   The focus of this group is to discuss various aspects of wellness, balancing those aspects and exploring ways to increase the ability to experience wellness.  Patients will create a wellness toolbox for use upon discharge.   Participation Level:  Active  Participation Quality:  Intrusive  Affect:  Appropriate  Cognitive:  Alert and Appropriate  Insight:  Limited  Engagement in Group:  Engaged  Additional Comments:  Pt. Participated in group and shared but was intrusive at times. Pt. Was redirectable.   Ruta Hinds [facility] 04/29/2012, 11:35 AM

## 2012-04-29 NOTE — Progress Notes (Signed)
Pt was incontinent in bed upon first assessment.  She rated her sleep as fair appetite improving energy high ability to pay attention good. She rated her depression a 10 denied hopelessness and her anxiety a 10 on her self-inventory.  She denies any symptoms of withdrawal. She denies nay S/H ideation or A/VB hallucinations.  She has c/o left knee pain at 10 and had percocet at 1057 with relief reported.  She has been up for most groups today and was started on gabapentin 100 mg bid.  She is not clear where she is going at discharge.  She stated,"I'm hoping to go back to [facility] in [place]".   She claims she is going to marry her girlfriend on Valentines day.

## 2012-04-29 NOTE — Clinical Social Work Note (Signed)
Interdisciplinary Treatment Plan Update (Adult)                                                                                                                                                                                                          Date: 04/29/2012   Time Reviewed: 9:47 AM   Progress in Treatment: Attending groups: Yes Participating in groups: Yes Taking medication as prescribed:  Yes Tolerating medication:  Yes Family/Significant othe contact made: Yes Guardian [name] Patient understands diagnosis: Yes Discussing patient identified problems/goals with staff: Yes Medical problems stabilized or resolved:  Yes Denies suicidal/homicidal ideation: Yes Issues/concerns per patient self-inventory: Rates Depression and anxiety at a 10 yet this may very well be baseline will consult guardian Other: N/A  New problem(s) identified: None Identified  Reason for Continuation of Hospitalization: Other; describe: placement.  FL2 sent out on 1/3; waiting on placement  Interventions implemented related to continuation of hospitalization: mood stabilization, medication monitoring and adjustment, group therapy and psycho education, suicide risk assessment, collateral contact, aftercare planning, ongoing physician assessments and safety checks q 15 mins  Additional comments: N/A  Estimated length of stay: 1-3 days  Discharge Plan:  CSW is assessing for appropriate referrals.   New goal(s): N/A  Review of initial/current patient goals per problem list:    1. Goal(s): Address suicidal ideation  Met: Yes As evidenced by: Patient report  2. Goal(s): Identify comprehensive mental wellness and placement in ALF plan  Met: No  Target date:1/8  As evidenced by: Obtaining placement with support of Guardian and  CSW   Attendees: Patient:     Family:     Physician:  Geoffery Lyons 04/29/2012 9:47 AM   Nursing:   Roswell Miners, RN 04/29/2012 9:47 AM   Clinical Social Worker Ronda Fairly 04/29/2012 9:47 AM   Other:  Robbie Louis RN 04/29/2012 9:47 AM   Other:   04/29/2012 9:47 AM   Other:   04/29/2012 9:47 AM   Other:   04/29/2012 9:47 AM    Scribe for Treatment Team:   Carney Bern, LCSWA  04/29/2012 9:47 AM

## 2012-04-29 NOTE — Progress Notes (Signed)
[facility] MD Progress Note  04/29/2012 2:39 PM Lori [name]  MRN:  [id number] Subjective:  10/10 depression and anxiety Diagnosis:   Axis I: Anxiety Disorder NOS and Depressive Disorder NOS Axis II: Deferred Axis III:  Past Medical History  Diagnosis Date  . Spinal bifida, closed   . UTI (lower urinary tract infection)   . Seizures   . Cancer     colon  . Genital herpes   . Arthritis   . Hydrocephalus   . Psychoses    Axis IV: other psychosocial or environmental problems, problems related to social environment and problems with primary support group Axis V: 41-50 serious symptoms  ADL's:  Intact  Sleep: Fair  Appetite:  Fair  Suicidal Ideation:  Denies Homicidal Ideation:  Denies  Psychiatric Specialty Exam: Review of Systems  Constitutional: Negative.   HENT: Negative.   Eyes: Negative.   Respiratory: Negative.   Cardiovascular: Negative.   Gastrointestinal: Negative.   Genitourinary: Negative.   Musculoskeletal:       Knee pain  Skin: Negative.   Neurological: Negative.   Endo/Heme/Allergies: Negative.   Psychiatric/Behavioral: Positive for depression. The patient is nervous/anxious.     Blood pressure 114/67, pulse 114, temperature 98.9 F (37.2 C), temperature source Oral, resp. rate 18, SpO2 96.00%.There is no height or weight on file to calculate BMI.  General Appearance: Casual  Eye Contact::  Fair  Speech:  Normal Rate  Volume:  Normal  Mood:  Anxious and Depressed  Affect:  Congruent  Thought Process:  Coherent  Orientation:  Full (Time, Place, and Person)  Thought Content:  WDL  Suicidal Thoughts:  No  Homicidal Thoughts:  No  Memory:  Immediate;   Fair Recent;   Fair Remote;   Fair  Judgement:  Fair  Insight:  Fair  Psychomotor Activity:  Decreased  Concentration:  Fair  Recall:  Fair  Akathisia:  No  Handed:  Right  AIMS (if indicated):     Assets:  Communication Skills Desire for Improvement Social Support  Sleep:  Number of Hours:  5    Current Medications: Current Facility-Administered Medications  Medication Dose Route Frequency Provider Last Rate Last Dose  . acetaminophen (TYLENOL) tablet 650 mg  650 mg Oral Q6H PRN Verne Spurr, PA-C   650 mg at 04/11/12 2248  . alum & mag hydroxide-simeth (MAALOX/MYLANTA) 200-200-20 MG/5ML suspension 30 mL  30 mL Oral Q4H PRN Verne Spurr, PA-C   30 mL at 04/23/12 1144  . calamine lotion   Topical TID PRN Nelly Rout, MD      . chlorproMAZINE (THORAZINE) tablet 100 mg  100 mg Oral TID PRN Nelly Rout, MD   100 mg at 04/28/12 2112  . citalopram (CELEXA) tablet 40 mg  40 mg Oral QHS Nelly Rout, MD   40 mg at 04/28/12 2112  . feeding supplement (ENSURE COMPLETE) liquid 237 mL  237 mL Oral TID PRN [name] Adams, PA   237 mL at 04/28/12 1416  . loratadine (CLARITIN) tablet 10 mg  10 mg Oral Daily Nelly Rout, MD   10 mg at 04/29/12 0906  . lurasidone (LATUDA) tablet 80 mg  80 mg Oral QHS Himabindu Ravi, MD   80 mg at 04/28/12 2112  . magnesium hydroxide (MILK OF MAGNESIA) suspension 30 mL  30 mL Oral Daily PRN Verne Spurr, PA-C      . nicotine polacrilex (NICORETTE) gum 2 mg  2 mg Oral Q2H PRN [name] Adams, PA   2 mg  at 04/28/12 1416  . oxyCODONE-acetaminophen (PERCOCET/ROXICET) 5-325 MG per tablet 1 tablet  1 tablet Oral Q6H PRN Nelly Rout, MD   1 tablet at 04/29/12 1057  . pantoprazole (PROTONIX) EC tablet 20 mg  20 mg Oral Daily Nelly Rout, MD   20 mg at 04/29/12 0904  . PHENobarbital (LUMINAL) tablet 64.8 mg  64.8 mg Oral QHS Nelly Rout, MD   64.8 mg at 04/28/12 2112  . polyethylene glycol (MIRALAX / GLYCOLAX) packet 17 g  17 g Oral Daily Nelly Rout, MD   17 g at 04/27/12 0854  . selenium sulfide (SELSUN) 2.5 % shampoo   Topical Daily PRN Nelly Rout, MD      . simvastatin (ZOCOR) tablet 20 mg  20 mg Oral q1800 Nelly Rout, MD   20 mg at 04/28/12 1711  . traZODone (DESYREL) tablet 50 mg  50 mg Oral QHS PRN,MR X 1 Mike Craze, MD   50 mg at 04/28/12  2245    Lab Results: No results found for this or any previous visit (from the past 48 hour(s)).  Physical Findings: AIMS: Facial and Oral Movements Muscles of Facial Expression: None, normal Lips and Perioral Area: None, normal Jaw: None, normal Tongue: None, normal,Extremity Movements Upper (arms, wrists, hands, fingers): None, normal Lower (legs, knees, ankles, toes): None, normal, Trunk Movements Neck, shoulders, hips: None, normal, Overall Severity Severity of abnormal movements (highest score from questions above): None, normal Incapacitation due to abnormal movements: None, normal Patient's awareness of abnormal movements (rate only patient's report): No Awareness, Dental Status Current problems with teeth and/or dentures?: No Does patient usually wear dentures?: No  CIWA:  CIWA-Ar Total: 0  COWS:     Treatment Plan Summary: Daily contact with patient to assess and evaluate symptoms and progress in treatment Medication management  Plan:  Review of chart, vital signs, medications, and notes. 1-Individual and group therapy 2-Medication management for depression and anxiety:  Medications reviewed with the patient and she asked for her pain medication to be increased, neurotonin ordered BID to assist her knee pain 3-Coping skills for depression, anxiety, and pain 4-Continue crisis stabilization and management:  Patient said she saw a "green and purple dragon and heard my mother calling me home."  Denies hallucinations today. 5-Treatment plan in progress to prevent relapse of depression and anxiety 6-Supportive environment to optimize care  Medical Decision Making Problem Points:  Established problem, worsening (2) and Review of psycho-social stressors (1) Data Points:  Review of medication regiment & side effects (2) Review of new medications or change in dosage (2)  I certify that inpatient services furnished can reasonably be expected to improve the patient's condition.    Nanine Means, PMH-NP 04/29/2012, 2:39 PM

## 2012-04-29 NOTE — Clinical Social Work Note (Signed)
[facility] LCSW Group Therapy   Type of Therapy:  Discharge Planning  Participation Level:  Did Not Attend   Clide Dales 04/29/2012 10:01 PM

## 2012-04-30 DIAGNOSIS — F411 Generalized anxiety disorder: Secondary | ICD-10-CM

## 2012-04-30 MED ORDER — GUAIFENESIN ER 600 MG PO TB12
600.0000 mg | ORAL_TABLET | Freq: Two times a day (BID) | ORAL | Status: DC
  Administered 2012-04-30 – 2012-05-02 (×4): 600 mg via ORAL
  Filled 2012-04-30 (×6): qty 1

## 2012-04-30 NOTE — Progress Notes (Signed)
Patient ID: Lori [name], female   DOB: Oct 13, 1970, 42 y.o.   MRN: [id number] [facility] MD Progress Note  04/30/2012 11:42 AM Lori [name]  MRN:  [id number]  Subjective:  "I feel more depressed today.  I had a dream about my girl-friend committing suicide last night. This is the 4th time that I have had this awful dream. It scared me to death. I have been seeing a red and green dragon trying to attack me. I also hear my dead mother calling me to come join her in heaven. I'm congested, coughing up some phlegm, no color to it".  O: Patient observed sitting up in a wheel. Propels wheel chair per self.   Diagnosis:   Axis I: Anxiety Disorder NOS and Depressive Disorder NOS Axis II: Deferred Axis III:  Past Medical History  Diagnosis Date  . Spinal bifida, closed   . UTI (lower urinary tract infection)   . Seizures   . Cancer     colon  . Genital herpes   . Arthritis   . Hydrocephalus   . Psychoses    Axis IV: other psychosocial or environmental problems, problems related to social environment and problems with primary support group Axis V: 41-50 serious symptoms  ADL's:  Intact  Sleep: Fair  Appetite:  Fair  Suicidal Ideation: "No" Denies Homicidal Ideation: "No" Denies  Psychiatric Specialty Exam: Review of Systems  Constitutional: Negative.   HENT: Negative.   Eyes: Negative.   Respiratory: Positive for cough (and congestion).   Cardiovascular: Negative.   Gastrointestinal: Negative.   Genitourinary: Negative.   Musculoskeletal: Negative.        Patient currently mobilizes within the unit on a wheel chair. She also wears bilateral leg braces due to lower extremity weakness.  Skin: Negative.   Neurological: Negative.   Endo/Heme/Allergies: Negative.   Psychiatric/Behavioral: Positive for depression (Currently being stabilized with medication, rated depression at #10) and hallucinations ("I have been seeing a red and blue dragon trying to attack and I see my dead  mother telling me to come to heaven with her"). Negative for suicidal ideas. The patient is nervous/anxious.     Blood pressure 125/78, pulse 120, temperature 99.5 F (37.5 C), temperature source Oral, resp. rate 18, SpO2 96.00%.There is no height or weight on file to calculate BMI.  General Appearance: Casual  Eye Contact::  Fair  Speech:  Normal Rate  Volume:  Normal  Mood:  Anxious and Depressed, rated both depression and anxiety @ #10.  Affect:  Flat, depressed and teary.  Thought Process:  Coherent  Orientation:  Full (Time, Place, and Person)  Thought Content:  Reports both auditory and visual hallucinations.  Suicidal Thoughts:  No  Homicidal Thoughts:  No  Memory:  Immediate;   Fair Recent;   Fair Remote;   Fair  Judgement:  Fair  Insight:  Fair  Psychomotor Activity:  Decreased  Concentration:  Fair  Recall:  Fair  Akathisia:  No  Handed:  Right  AIMS (if indicated):     Assets:  Communication Skills Desire for Improvement Social Support  Sleep:  Number of Hours: 5    Current Medications: Current Facility-Administered Medications  Medication Dose Route Frequency Provider Last Rate Last Dose  . acetaminophen (TYLENOL) tablet 650 mg  650 mg Oral Q6H PRN Verne Spurr, PA-C   650 mg at 04/11/12 2248  . alum & mag hydroxide-simeth (MAALOX/MYLANTA) 200-200-20 MG/5ML suspension 30 mL  30 mL Oral Q4H PRN Verne Spurr, PA-C   30  mL at 04/23/12 1144  . calamine lotion   Topical TID PRN Nelly Rout, MD      . chlorproMAZINE (THORAZINE) tablet 100 mg  100 mg Oral TID PRN Nelly Rout, MD   100 mg at 04/28/12 2112  . citalopram (CELEXA) tablet 40 mg  40 mg Oral QHS Nelly Rout, MD   40 mg at 04/29/12 2125  . feeding supplement (ENSURE COMPLETE) liquid 237 mL  237 mL Oral TID PRN [name] Adams, PA   237 mL at 04/29/12 1517  . gabapentin (NEURONTIN) capsule 100 mg  100 mg Oral BID Nanine Means, NP   100 mg at 04/30/12 0834  . guaiFENesin (MUCINEX) 12 hr tablet 600 mg  600  mg Oral BID Sanjuana Kava, NP      . loratadine (CLARITIN) tablet 10 mg  10 mg Oral Daily Nelly Rout, MD   10 mg at 04/30/12 0840  . lurasidone (LATUDA) tablet 80 mg  80 mg Oral QHS Himabindu Ravi, MD   80 mg at 04/29/12 2125  . magnesium hydroxide (MILK OF MAGNESIA) suspension 30 mL  30 mL Oral Daily PRN Verne Spurr, PA-C      . nicotine polacrilex (NICORETTE) gum 2 mg  2 mg Oral Q2H PRN [name] Adams, PA   2 mg at 04/28/12 1416  . oxyCODONE-acetaminophen (PERCOCET/ROXICET) 5-325 MG per tablet 1 tablet  1 tablet Oral Q6H PRN Nelly Rout, MD   1 tablet at 04/30/12 0951  . pantoprazole (PROTONIX) EC tablet 20 mg  20 mg Oral Daily Nelly Rout, MD   20 mg at 04/30/12 0834  . PHENobarbital (LUMINAL) tablet 64.8 mg  64.8 mg Oral QHS Nelly Rout, MD   64.8 mg at 04/29/12 2125  . polyethylene glycol (MIRALAX / GLYCOLAX) packet 17 g  17 g Oral Daily Nelly Rout, MD   17 g at 04/27/12 0854  . selenium sulfide (SELSUN) 2.5 % shampoo   Topical Daily PRN Nelly Rout, MD      . simvastatin (ZOCOR) tablet 20 mg  20 mg Oral q1800 Nelly Rout, MD   20 mg at 04/29/12 1727  . traZODone (DESYREL) tablet 50 mg  50 mg Oral QHS PRN,MR X 1 Mike Craze, MD   50 mg at 04/29/12 2126    Lab Results: No results found for this or any previous visit (from the past 48 hour(s)).  Physical Findings: AIMS: Facial and Oral Movements Muscles of Facial Expression: None, normal Lips and Perioral Area: None, normal Jaw: None, normal Tongue: None, normal,Extremity Movements Upper (arms, wrists, hands, fingers): None, normal Lower (legs, knees, ankles, toes): None, normal, Trunk Movements Neck, shoulders, hips: None, normal, Overall Severity Severity of abnormal movements (highest score from questions above): None, normal Incapacitation due to abnormal movements: None, normal Patient's awareness of abnormal movements (rate only patient's report): No Awareness, Dental Status Current problems with teeth  and/or dentures?: No Does patient usually wear dentures?: No  CIWA:  CIWA-Ar Total: 0  COWS:     Treatment Plan Summary: Daily contact with patient to assess and evaluate symptoms and progress in treatment Medication management  Plan:  Supportive approach/coping skills. Start Mucinex 600 mg bid x 10 doses. Continue to monitor response to treatment plan and or any adverse effects. Continue current plan of care.  Medical Decision Making Problem Points:  Established problem, worsening (2) and Review of psycho-social stressors (1) Data Points:  Review of medication regiment & side effects (2) Review of new medications or  change in dosage (2)  I certify that inpatient services furnished can reasonably be expected to improve the patient's condition.   Armandina Stammer I, PMH-NP 04/30/2012, 11:42 AM

## 2012-04-30 NOTE — Progress Notes (Signed)
Psychoeducational Group Note  Date:  04/30/2012 Time:  1000  Group Topic/Focus:  Pt participated in coping skills pictionary activity. Pt was asked to identify one coping skill they could use that they learned from the game.  Participation Level:  Active  Participation Quality:  Intrusive  Affect:  Appropriate  Cognitive:  Appropriate  Insight:  Engaged  Engagement in Group:  Engaged  Additional Comments:  Lori [name] participated in coping skills pictionary activity. She identified a coping skill she could use after discharge which was prayer and meditation.   [name] 04/30/2012, 10:58 AM

## 2012-04-30 NOTE — Clinical Social Work Note (Signed)
[facility] LCSW Group Therapy   Type of Therapy:  Discharge Planning  Participation Level:  Active  Participation Quality:  Attentive and Sharing  Affect:   Congruent  Cognitive:  Alert and Oriented  Insight:  Limited  Engagement in Therapy:  Limited  Modes of Intervention:  Discussion, Exploration and Rapport Building  Summary of Progress/Problems:  Pt denies both suicidal and homicidal ideation. On a scale of 1 to 10 with ten being the most ever experienced, the patient rates depression at a 10 and anxiety at a 10; patient also states this is normal, or baseline, for her. Patient insists she does not wish to return to [place]; interested in [place].  Patient's response to information that guardian will be coming to [facility] on Wednesday was positive.     Clide Dales 04/30/2012 8:36 AM
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
## 2012-04-30 NOTE — Progress Notes (Signed)
Pt observed in bed already.  States she is tired and wants to go to bed early.  Pt was given her HS meds and situated in bed.  Pt's pitcher was filled and her bed was moved closer to the nightstand so she could reach the pitcher.  Pt denies SI/HI/AV at this time.  Pt asks about going to an ALF that a FL-2 was sent to.  Encouraged pt to discuss her concerns with the case manager as this Clinical research associate did not have that information.  During this encounter, pt was polite/cooperative.  Support/encouragement given.  Pt makes her needs known to staff.  Will make an effort throughout the night to keep pt dry as she is incontinent of bladder and bowels.  Safety maintained with q15 minute checks.

## 2012-04-30 NOTE — Progress Notes (Signed)
Reviewed

## 2012-04-30 NOTE — Progress Notes (Addendum)
Pt reports sleep as poor appetite poor energy high ability to pay attention as good.  She rated her depression and anxiety a 10 and denied hopelessness on her self-inventory.  She denied any thoughts of S/H ideation or A/V hallucinations.  She is still hoping to to go to [facility] as soon as bed comes open.  This nurse not quite sure if this is the plan or not..  No new orders thus far and she had prn percocet at 0951 for pain in her left knee which was helpful to decrease the pain.  Pt became extremely agitated when she asked for her medication for anxiety. This nurse was trying to ask pt what was going on with her that she needed anxiety medication.  She started to curse this nurse out.  She called me a "fucking bitch and she wished I would die"  Tried to reason with pt but she went into her room and took her shoe strings from her braces and threatened to kill herself if she can't get her medication now.  Pt was given her meds along with her 1700 meds and Catha Nottingham NP talked with pt for a while which did not seem to help.  Pt did contract for safety after she calmed down and went down to gym with staff and her peers.  She did go down to cafeteria for dinner and back to dayroom.  She did apologize for her behavior but she made poor eye contact as she was talking with this nurse.

## 2012-04-30 NOTE — Progress Notes (Signed)
[facility] LCSW Group Therapy   Type of Therapy:  Group Therapy  Participation Level:  Active  Participation Quality:  Attentive and Intrusive  Affect:  Excited  Cognitive:  Alert and Oriented  Insight:  Limited  Engagement in Therapy:  Engaged  Modes of Intervention:  Discussion, Problem-solving, Rapport Building, Socialization and Support  Summary of Progress/Problems:  Pt was attentive to speaker volunteer from [facility] who described a portion of his own story and services offered at [facility] yet was intrusive with comments. Marilyn was reminded that she will be leaving [facility] thus unable to take advantage of services yet she continued to interrupt.    Clide Dales 04/30/2012 5:11 PM

## 2012-04-30 NOTE — Progress Notes (Signed)
Psychoeducational Group Note  Date:  04/30/2012 Time:  2000  Group Topic/Focus:  AA  Participation Level:  Minimal  Participation Quality:  Attentive  Affect:  Flat  Cognitive:  Alert  Insight:  Limited  Engagement in Group:  Limited  Additional Comments:    Humberto Seals Monique 04/30/2012, 9:55 PM

## 2012-05-01 MED ORDER — HYDROXYZINE HCL 25 MG PO TABS
25.0000 mg | ORAL_TABLET | Freq: Four times a day (QID) | ORAL | Status: DC
  Administered 2012-05-01 – 2012-05-02 (×4): 25 mg via ORAL
  Filled 2012-05-01 (×7): qty 1

## 2012-05-01 NOTE — Progress Notes (Signed)
Pt says she is still depressed.  She denies SI/HI/AV.  She is hoping she can go the the ALF in [place].  On the unit, she is needy and demanding.  She tends to be irritable when she doesn't get her way about things she wants.  She is able to do some things for herself, but tends to want staff to do everything for her.  She uses a wheelchair to get around.  She refuses to use the toilet when encouraged.  She makes her needs known to staff.  Safety maintained with q15 minute checks.

## 2012-05-01 NOTE — Progress Notes (Signed)
D:  Pt got upset during group states that she told Santina Evans that she could relate to what people were saying in the group and states that Santina Evans told her that she doesn't have to relate to everything, pt said "Fuck you Santina Evans", and left the group very upset, pt states "I'm not going to anymore groups, I don't care if I stay here forever, I'm not going to groups, I don't have any reason to live, if I had a gun right now I would blow my brains out."    A:  Verbally deescalated pt, prn anxiety medication administered per orders, see MAR, pt using coping skills, deep breathing and taking wheelchair up and down hall to calm down.  q5 minute safety checks while pt is trying to calm down.  R:  Pt not receptive to treatment plan at this time, anxious.

## 2012-05-01 NOTE — Progress Notes (Signed)
Patient ID: Lori [name], female   DOB: Sep 05, 1970, 42 y.o.   MRN: [id number]  PER STATE REGULATIONS 482.30  THIS CHART WAS REVIEWED FOR MEDICAL NECESSITY WITH RESPECT TO THE PATIENT'S ADMISSION/ DURATION OF STAY.  NEXT REVIEW DATE: 05/04/2012  Willa Rough, RN, BSN CASE MANAGER

## 2012-05-01 NOTE — Clinical Social Work Note (Signed)
CSW met with patient's guardian [name] to discuss placement issues.  Guardian advocated for patient to discharge to facility in [facility] vs [facility] in [place], [place] from which she came.  Guardian disclosed that patient is completely capable of taking care of daily toileting needs and has been manipulating staff since admit of 04/11/12. Lead CSW became involved in conversation and agreement made to provide guardian the morning of 05/02/12 to access for placement in [facility]; if guardian is unable to obtain potential placement the morning of 05/02/12 CSW will procede with placement at [facility].  Guardian signed ROI for placement.   Clide Dales

## 2012-05-01 NOTE — Progress Notes (Signed)
D:  Pt was unable to complete self inventory sheet, pt states "I don't know how to do it, I need someone to fill it out for me, my writing isn't good, pt verbally reports appetite decreased but she is taking ensure TID between meals, rates depression at 10 out of 10 and hopelessness at a 10 out of 10.  Pt +SI with plan to "get a razor and slit my throat."  Denies HI, +AVH, hears "My mothers voice telling me to come be with her in heaven, and I see a red and green dragon following me around and spitting fire at me."  Pt states that she has been having AVH "since I came in here".  Pt verbally contracts to come to staff before acting on any SI/thoughts to hurt self.  A:  Emotional support provided, Encouraged pt to continue with treatment plan and attend all group activities, q15 min checks maintained for safety.  R:  Pt is needy, and seems to try and manipulate staff, pt is able to wipe herself when she has an incontinent episode however she refuses to clean self up and tries to get staff to do it for her and per some of the techs she seems to get enjoyment out of having staff clean her up.  Pt needs encouragement to go to groups and this am she wanted to sit in the middle of the circle (inthe middle of the room) instead of being part of the circle like all of the other patients when told to move out of the middle of the room pt got angry, pt got upset during group while others were sharing and left group crying.

## 2012-05-01 NOTE — Progress Notes (Signed)
Psychoeducational Group Note  Date:  05/01/2012 Time:  1100  Group Topic/Focus:  Personal Choices and Values:   The focus of this group is to help patients assess and explore the importance of values in their lives, how their values affect their decisions, how they express their values and what opposes their expression.  Participation Level:  Active  Participation Quality:  Appropriate, Attentive and Sharing  Affect:  Appropriate  Cognitive:  Appropriate  Insight:  Engaged  Engagement in Group:  Engaged  Additional Comments:  Pt was appropriate and attentive while attending group. Pt shared that her personal goals are being loved, and having good friends. Pt also stated that she wanted to grow in being able to help peop,e and having a support system.   Sharyn Lull 05/01/2012, 11:54 AM

## 2012-05-01 NOTE — Progress Notes (Signed)
[facility] MD Progress Note  05/01/2012 2:47 PM Lori [name]  MRN:  [id number] Subjective:  8/10 depression and anxiety Diagnosis:   Axis I: Anxiety Disorder NOS and Depressive Disorder NOS Axis II: Deferred Axis III:  Past Medical History  Diagnosis Date  . Spinal bifida, closed   . UTI (lower urinary tract infection)   . Seizures   . Cancer     colon  . Genital herpes   . Arthritis   . Hydrocephalus   . Psychoses    Axis IV: housing problems, other psychosocial or environmental problems, problems related to social environment and problems with primary support group Axis V: 41-50 serious symptoms  ADL's:  Intact  Sleep: Good  Appetite:  Fair  Suicidal Ideation:  Denies Homicidal Ideation:  Denies  Psychiatric Specialty Exam: Review of Systems  Constitutional: Negative.   HENT: Negative.   Eyes: Negative.   Respiratory: Negative.   Cardiovascular: Negative.   Gastrointestinal: Negative.   Genitourinary: Negative.   Musculoskeletal: Negative.   Skin: Negative.   Neurological: Negative.   Endo/Heme/Allergies: Negative.   Psychiatric/Behavioral: Positive for depression. The patient is nervous/anxious.     Blood pressure 113/75, pulse 100, temperature 97.8 F (36.6 C), temperature source Oral, resp. rate 16, SpO2 96.00%.There is no height or weight on file to calculate BMI.  General Appearance: Casual  Eye Contact::  Fair  Speech:  Normal Rate  Volume:  Normal  Mood:  Anxious and Depressed  Affect:  Flat  Thought Process:  Coherent  Orientation:  Full (Time, Place, and Person)  Thought Content:  WDL  Suicidal Thoughts:  No  Homicidal Thoughts:  No  Memory:  Immediate:  Fair Recent:  Fair  Judgement:  Fair  Insight:  Lacking  Psychomotor Activity:  Normal  Concentration:  Fair  Recall:  Fair  Akathisia:  No  Handed:  Right  AIMS (if indicated):     Assets:  Resilience  Sleep:  Number of Hours: 5    Current Medications: Current Facility-Administered  Medications  Medication Dose Route Frequency Provider Last Rate Last Dose  . acetaminophen (TYLENOL) tablet 650 mg  650 mg Oral Q6H PRN Verne Spurr, PA-C   650 mg at 04/11/12 2248  . alum & mag hydroxide-simeth (MAALOX/MYLANTA) 200-200-20 MG/5ML suspension 30 mL  30 mL Oral Q4H PRN Verne Spurr, PA-C   30 mL at 04/23/12 1144  . calamine lotion   Topical TID PRN Nelly Rout, MD      . citalopram (CELEXA) tablet 40 mg  40 mg Oral QHS Nelly Rout, MD   40 mg at 04/30/12 2143  . feeding supplement (ENSURE COMPLETE) liquid 237 mL  237 mL Oral TID PRN [name] Adams, PA   237 mL at 05/01/12 1258  . gabapentin (NEURONTIN) capsule 100 mg  100 mg Oral BID Nanine Means, NP   100 mg at 05/01/12 0829  . guaiFENesin (MUCINEX) 12 hr tablet 600 mg  600 mg Oral BID Sanjuana Kava, NP   600 mg at 05/01/12 4098  . hydrOXYzine (ATARAX/VISTARIL) tablet 25 mg  25 mg Oral QID Nanine Means, NP      . loratadine (CLARITIN) tablet 10 mg  10 mg Oral Daily Nelly Rout, MD   10 mg at 05/01/12 0829  . lurasidone (LATUDA) tablet 80 mg  80 mg Oral QHS Himabindu Ravi, MD   80 mg at 04/30/12 2143  . magnesium hydroxide (MILK OF MAGNESIA) suspension 30 mL  30 mL Oral Daily PRN Verne Spurr,  PA-C      . nicotine polacrilex (NICORETTE) gum 2 mg  2 mg Oral Q2H PRN [name] Adams, PA   2 mg at 05/01/12 1258  . oxyCODONE-acetaminophen (PERCOCET/ROXICET) 5-325 MG per tablet 1 tablet  1 tablet Oral Q6H PRN Nelly Rout, MD   1 tablet at 05/01/12 1148  . pantoprazole (PROTONIX) EC tablet 20 mg  20 mg Oral Daily Nelly Rout, MD   20 mg at 05/01/12 0829  . PHENobarbital (LUMINAL) tablet 64.8 mg  64.8 mg Oral QHS Nelly Rout, MD   64.8 mg at 04/30/12 2143  . polyethylene glycol (MIRALAX / GLYCOLAX) packet 17 g  17 g Oral Daily Nelly Rout, MD   17 g at 04/27/12 0854  . selenium sulfide (SELSUN) 2.5 % shampoo   Topical Daily PRN Nelly Rout, MD      . simvastatin (ZOCOR) tablet 20 mg  20 mg Oral q1800 Nelly Rout, MD    20 mg at 04/30/12 1507  . traZODone (DESYREL) tablet 50 mg  50 mg Oral QHS PRN,MR X 1 Mike Craze, MD   50 mg at 05/01/12 0037    Lab Results: No results found for this or any previous visit (from the past 48 hour(s)).  Physical Findings: AIMS: Facial and Oral Movements Muscles of Facial Expression: None, normal Lips and Perioral Area: None, normal Jaw: None, normal Tongue: None, normal,Extremity Movements Upper (arms, wrists, hands, fingers): None, normal Lower (legs, knees, ankles, toes): None, normal, Trunk Movements Neck, shoulders, hips: None, normal, Overall Severity Severity of abnormal movements (highest score from questions above): None, normal Incapacitation due to abnormal movements: None, normal Patient's awareness of abnormal movements (rate only patient's report): No Awareness, Dental Status Current problems with teeth and/or dentures?: No Does patient usually wear dentures?: No  CIWA:  CIWA-Ar Total: 0  COWS:     Treatment Plan Summary: Daily contact with patient to assess and evaluate symptoms and progress in treatment Medication management  Plan:  Review of chart, vital signs, medications, and notes. 1-Individual and group therapy 2-Medication management for depression and anxiety:  Medications reviewed with the patient and thorazine discontinued due to patient's request and vistaril ordered instead for her anxiety. 3-Coping skills for depression, anxiety, and pain:  When patient gets upset she states she is suicidal but can be easily redirected and this quickly resolves with a little attention and time spent with her.  Lori [name] states she sees a Nurse, children's and hears her dead mother at times but when she talks about it, she is not upset and can't confirm. 4-Continue crisis stabilization and management 5-Address health issues--monitoring spinal bifida and issues with her disorder, stable 6-Treatment plan in progress to prevent relapse of depression and anxiety  Medical  Decision Making Problem Points:  Review of last therapy session (1) and Review of psycho-social stressors (1) Data Points:  Review of new medications or change in dosage (2)  I certify that inpatient services furnished can reasonably be expected to improve the patient's condition.   Nanine Means, PMH-NP 05/01/2012, 2:47 PM

## 2012-05-01 NOTE — Progress Notes (Signed)
Pt reports she has been upset today.  Her guardian visited to discuss discharge plans.  She disclosed that pt is able to do more ADLs for herself than she has been letting staff know.  Pt is observed in the dayroom working on a puzzle.  She refuses to go to group, saying the group early this morning made her angry and she was not going to any more groups.  Staff has begun to expect more of the patient and she does not like this.  Pt says her guardian is working on a plan to get her out of here.  She also said a lawyer came to see her today, but there is no documentation of this happening.  Support/encouragement given.  Pt makes her needs known to staff.  Safety maintained with q15 minute checks.

## 2012-05-01 NOTE — Progress Notes (Signed)
Psychoeducational Group Note  Date:  05/01/2012 Time:  2000  Group Topic/Focus: AA Meeting  Participation Level:  Did Not Attend  Participation Quality:    Affect:    Cognitive:    Insight:    Engagement in Group:    Additional Comments:  Patient remained in medication room for the duration of AA meeting tonight.  Cristabel Bicknell, Newton Pigg 05/01/2012, 11:38 PM

## 2012-05-01 NOTE — Clinical Social Work Note (Signed)
Interdisciplinary Treatment Plan Update (Adult)  Date: 05/01/2012  Time Reviewed: 11:36 AM   Progress in Treatment: Attending groups: Yes Participating in groups: Yes Taking medication as prescribed:  Yes Tolerating medication:  Yes Family/Significant othe contact made: Yes with Guardian Patient understands diagnosis: Yes Discussing patient identified problems/goals with staff: Yes Medical problems stabilized or resolved:  Yes Denies suicidal/homicidal ideation: Yes Issues/concerns per patient self-inventory: Placement Other: N/A  New problem(s) identified: None Identified  Reason for Continuation of Hospitalization: Other; describe Placement issues, patient's guardian is to come in today  Interventions implemented related to continuation of hospitalization: mood stabilization, medication monitoring and adjustment, group therapy and psycho education, suicide risk assessment, collateral contact, aftercare planning, ongoing physician assessments and safety checks q 15 mins  Additional comments: NA  Estimated length of stay: 1-2 days  Discharge Plan:  CSW is meeting with pt's guardian.  New goal(s): N/A  Review of initial/current patient goals per problem list:    1. Goal(s): Address suicidal ideation  Met: Yes   As evidenced by: Patient report 2. Goal (s): Reduce depressive symptoms  Met: Yes As evidenced by: Pt report 3. Goal(s): Identify comprehensive mental wellness plan and placement Met: No  Target date:1/9 As evidenced by: Guardian report, with support of CSW   Attendees: Patient:     Family:     Physician: Patrick North, MD 05/01/2012 11:36 AM   Nursing:    05/01/2012    Clinical Social Worker Ronda Fairly 05/01/2012 11:36 AM   Other:   05/01/2012    Other:   05/01/2012    Other:   05/01/2012    Other:   05/01/2012     Scribe for Treatment Team:   Carney Bern, LCSWA  05/01/2012 11:36 AM

## 2012-05-01 NOTE — Clinical Social Work Note (Signed)
[facility] LCSW Group Therapy   Type of Therapy:  Group Therapy  Participation Level:  Active  Participation Quality:  Intrusive, Monopolizing and Sharing  Affect:  Excited and Irritable  Cognitive:  Alert and Oriented  Insight:  Lacking  Engagement in Therapy:  Lacking  Modes of Intervention:  Discussion, Limit-setting and Socialization  Summary of Progress/Problems: Patient attended group therapy session and displayed irritation with facilitator when asked to join circle verses park wheelchair in center of group room.  Patient intrusively spoke up to share numerous times to point others were distracted. When asked by facilitator to listen in group verses share at each opportunity patient became verbally abusive to facilitator and left the group room.   Clide Dales 05/01/2012 5:33 PM

## 2012-05-01 NOTE — Clinical Social Work Note (Signed)
[facility] LCSW Group Therapy   Type of Therapy:  Discharge Planning  Participation Level:  Active  Participation Quality:  Intrusive  Affect:  Excited  Cognitive:  Alert and Oriented  Insight:  Lacking  Engagement in Therapy:  Lacking  Modes of Intervention:  Exploration, Orientation and Socialization  Summary of Progress/Problems:  Pt denied both suicidal ideation and homicidal ideation. She choose not to rate either her depression or anxiety on a scale of 1 to 10 with ten being the most ever experienced.  Lori [name] did share she is looking forward to getting married in February on Valentine's day.    Clide Dales 05/01/2012 7:31 PM

## 2012-05-02 MED ORDER — CALAMINE EX LOTN: 1.0000 "application " | TOPICAL_LOTION | Freq: Three times a day (TID) | CUTANEOUS | Status: DC | PRN

## 2012-05-02 MED ORDER — SELENIUM SULFIDE 1 % EX LOTN: 1.0000 "application " | TOPICAL_LOTION | Freq: Every day | CUTANEOUS | Status: DC | PRN

## 2012-05-02 MED ORDER — OXYCODONE-ACETAMINOPHEN 5-325 MG PO TABS: 1.0000 | ORAL_TABLET | Freq: Four times a day (QID) | ORAL | Status: DC | PRN

## 2012-05-02 MED ORDER — PANTOPRAZOLE SODIUM 20 MG PO TBEC: 20.0000 mg | DELAYED_RELEASE_TABLET | Freq: Every day | ORAL | Status: AC

## 2012-05-02 MED ORDER — LURASIDONE HCL 80 MG PO TABS: 80.0000 mg | ORAL_TABLET | Freq: Every day | ORAL | Status: DC

## 2012-05-02 MED ORDER — PRAVASTATIN SODIUM 40 MG PO TABS: 40.0000 mg | ORAL_TABLET | ORAL | Status: DC

## 2012-05-02 MED ORDER — GABAPENTIN 100 MG PO CAPS: 100.0000 mg | ORAL_CAPSULE | Freq: Two times a day (BID) | ORAL | Status: AC

## 2012-05-02 MED ORDER — PHENOBARBITAL 64.8 MG PO TABS: 64.8000 mg | ORAL_TABLET | Freq: Every day | ORAL | Status: DC

## 2012-05-02 MED ORDER — TRAZODONE HCL 50 MG PO TABS: 50.0000 mg | ORAL_TABLET | Freq: Every evening | ORAL | Status: DC | PRN

## 2012-05-02 MED ORDER — POLYETHYLENE GLYCOL 3350 17 G PO PACK: 17.0000 g | PACK | Freq: Every day | ORAL | Status: DC | PRN

## 2012-05-02 MED ORDER — CITALOPRAM HYDROBROMIDE 40 MG PO TABS: 40.0000 mg | ORAL_TABLET | Freq: Every day | ORAL | Status: AC

## 2012-05-02 MED ORDER — LORATADINE 10 MG PO TABS: 10.0000 mg | ORAL_TABLET | Freq: Every day | ORAL | Status: DC | PRN

## 2012-05-02 NOTE — Clinical Social Work Note (Signed)
[facility] LCSW Group Therapy   Type of Therapy:  Discharge Planning  Participation Level:  Did Not Attend  Clide Dales 05/02/2012 9:27 AM

## 2012-05-02 NOTE — Progress Notes (Signed)
D:  Lori [name] slept well last night.  Assisted her with cleaning herself up and taking a bath.  She performed all her ADL's with minimal assistance from staff.  She denies SI/HI/AVH at this time.  She is pleasant and appropriate this morning.  She was observed interacting with peers in dayroom. A:  Medications given as ordered.  Safety checks q 15 minutes.  Emotional support provided. R:  Safety maintained on unit.

## 2012-05-02 NOTE — Progress Notes (Signed)
Lori [name] was discharged to [facility] home.  Discharge instructions, follow up instructions, sample medications given.  Patient verbalized understanding.  Personal belongings returned to patient from locker 115.

## 2012-05-02 NOTE — [facility] Suicide Risk Assessment (Signed)
Suicide Risk Assessment  Discharge Assessment     Demographic Factors:  Female, single  Mental Status Per Nursing Assessment::   On Admission:     Current Mental Status by Physician: Alert and oriented to 4.  Patient denies AH/VH/SI/HI.  Loss Factors: Decline in physical health  Historical Factors: Impulsivity  Risk Reduction Factors:   Positive coping skills or problem solving skills  Continued Clinical Symptoms:  Depression:   Recent sense of peace/wellbeing  Cognitive Features That Contribute To Risk:  Thought constriction (tunnel vision)    Suicide Risk:  Minimal: No identifiable suicidal ideation.  Patients presenting with no risk factors but with morbid ruminations; may be classified as minimal risk based on the severity of the depressive symptoms  Discharge Diagnoses:   AXIS I:  Depressive Disorder NOS AXIS II:  Deferred AXIS III:   Past Medical History  Diagnosis Date  . Spinal bifida, closed   . UTI (lower urinary tract infection)   . Seizures   . Cancer     colon  . Genital herpes   . Arthritis   . Hydrocephalus   . Psychoses    AXIS IV:  economic problems and other psychosocial or environmental problems AXIS V:  61-70 mild symptoms  Plan Of Care/Follow-up recommendations:  Activity:  as tolerated since patient is in a wheelchair Diet:  normal Transition to group home.  Is patient on multiple antipsychotic therapies at discharge:  No   Has Patient had three or more failed trials of antipsychotic monotherapy by history:  No  Recommended Plan for Multiple Antipsychotic Therapies: NA  Lori [name] 05/02/2012, 10:49 AM
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
## 2012-05-03 MED ORDER — TRAZODONE HCL 50 MG PO TABS: 50.0000 mg | ORAL_TABLET | Freq: Every evening | ORAL | Status: DC | PRN

## 2012-05-03 NOTE — Progress Notes (Signed)
Reviewed

## 2012-05-06 NOTE — Discharge Summary (Signed)
Reviewed

## 2012-05-06 NOTE — Progress Notes (Signed)
Patient Discharge Instructions:  After Visit Summary (AVS):   Faxed to:  05/06/12 Psychiatric Admission Assessment Note:   Faxed to:  05/06/12 Suicide Risk Assessment - Discharge Assessment:   Faxed to:  05/06/12 Faxed/Sent to the Next Level Care provider:  05/06/12 Faxed to [facility] @ [phone]  Jerelene Redden, 05/06/2012, 4:07 PM

## 2012-07-08 ENCOUNTER — Ambulatory Visit: Payer: Self-pay | Admitting: Urgent Care

## 2012-07-17 ENCOUNTER — Ambulatory Visit: Payer: Self-pay | Admitting: Gastroenterology

## 2012-08-01 ENCOUNTER — Ambulatory Visit (INDEPENDENT_AMBULATORY_CARE_PROVIDER_SITE_OTHER): Payer: Medicare Other | Admitting: Gastroenterology

## 2012-08-01 ENCOUNTER — Encounter: Payer: Self-pay | Admitting: Gastroenterology

## 2012-08-01 VITALS — BP 122/73 | HR 118 | Temp 98.5°F | Ht <= 58 in | Wt 154.0 lb

## 2012-08-01 DIAGNOSIS — Z85038 Personal history of other malignant neoplasm of large intestine: Secondary | ICD-10-CM

## 2012-08-01 NOTE — Progress Notes (Signed)
Primary Care Physician:  Isabella Stalling, MD Primary Gastroenterologist:  Dr. Jena Gauss   Chief Complaint  Patient presents with  . Abdominal Pain    HPI:   Lori [name] is a 42 year old female who presents today at the request of Dr. Janna Arch secondary to abdominal pain. Resides at [facility]. States past few weeks has been having lower abdominal pain. She notes a history of colon cancer, stage 1, approximately 4 years ago. States her mother passed away from colon cancer 4 years ago. States was diagnosed at [facility]. Her only colonoscopy was at time of diagnosis. She then underwent a colon resection. She has not had a surveillance colonoscopy since then. I DO NOT HAVE ANY OPERATIVE NOTES TO VERIFY THIS.  Notes lower abdominal pain, at umbilicus. States this is where the pain was originally at time of diagnosis. Notes she has been having loose stool for several weeks, about twice per day. However, upon further discussion, denies this. Her caretaker present also states her bowel habits have not changed. Was at [facility] about a month ago, admitted for knee pain and suicidal thoughts. Saw low-volume hematochezia at that time. Taking Keflex for chronic UTIs.   Past Medical History  Diagnosis Date  . Spinal bifida, closed   . UTI (lower urinary tract infection)   . Seizures   . Cancer     colon  . Genital herpes   . Arthritis   . Hydrocephalus   . Psychoses   . Bipolar disease, chronic     per patient  . 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 Club foot release    . Spinal fusion    . Hip surgery    . Dental surgery    . Colon resection    . Shunt revision  1975    Current Outpatient Prescriptions  Medication Sig Dispense Refill  . chlorproMAZINE (THORAZINE) 100 MG tablet Take 100 mg by mouth 3 (three) times daily.      . citalopram (CELEXA) 40 MG tablet Take 1 tablet (40 mg  total) by mouth at bedtime. For depression  30 tablet  0  . gabapentin (NEURONTIN) 100 MG capsule Take 1 capsule (100 mg total) by mouth 2 (two) times daily. For anxiety  60 capsule  0  . loratadine (CLARITIN) 10 MG tablet Take 1 tablet (10 mg total) by mouth daily as needed. :For Allergies      . lurasidone (LATUDA) 80 MG TABS Take 1 tablet (80 mg total) by mouth at bedtime. For mood control  30 tablet  0  . pantoprazole (PROTONIX) 20 MG tablet Take 1 tablet (20 mg total) by mouth daily. For acid reflux      . PHENobarbital (LUMINAL) 64.8 MG tablet Take 1 tablet (64.8 mg total) by mouth at bedtime. For simple partial seizures.      . polyethylene glycol (MIRALAX / GLYCOLAX) packet Take 17 g by mouth daily as needed. For constipation  14 each    . calamine lotion Apply 1 application topically 3 (three) times daily as needed. :For Itching  120 mL    . oxyCODONE-acetaminophen (PERCOCET/ROXICET) 5-325 MG per tablet Take 1 tablet by mouth every 6 (six) hours as needed. For severe Pain.  30 tablet    . pravastatin (PRAVACHOL) 40 MG tablet Take 1 tablet (40 mg total) by mouth every morning. For cholesterol control  30  tablet    . selenium sulfide (SELSUN) 1 % LOTN Apply 1 application topically daily as needed. :For dry itchy scalp      . traZODone (DESYREL) 50 MG tablet Take 1 tablet (50 mg total) by mouth at bedtime as needed and may repeat dose one time if needed for sleep. For sleep/depression  30 tablet  0   No current facility-administered medications for this visit.    Allergies as of 08/01/2012 - Review Complete 08/01/2012  Allergen Reaction Noted  . Advil (ibuprofen) Rash 12/21/2011  . Bee venom Anaphylaxis 12/21/2011  . Crab (shellfish allergy) Anaphylaxis and Rash 12/21/2011  . Morphine and related Anaphylaxis 12/21/2011  . Aleve (naproxen sodium) Rash 12/21/2011  . Asa (aspirin) Rash 12/21/2011    Family History  Problem Relation Age of Onset  . Colon cancer Mother     History    Social History  . Marital Status: Single    Spouse Name: N/A    Number of Children: N/A  . Years of Education: N/A   Occupational History  . Not on file.   Social History Main Topics  . Smoking status: Current Every Day Smoker -- 2.00 packs/day for 1 years  . Smokeless tobacco: Not on file  . Alcohol Use: No     Comment: Rarely drinks and limits to no more than 4 drinks per episode  . Drug Use: No  . Sexually Active: No   Other Topics Concern  . Not on file   Social History Narrative  . No narrative on file    Review of Systems: Gen: SEE HPI CV: +palpitations Resp: Denies shortness of breath at rest or with exertion. Denies wheezing or cough.  GI: see HPI GU : see HPI MS: +joint pain  Derm: Denies rash, itching, dry skin Psych: +suicidal ideation, caretaker present, no access to harmful objects  Heme: Denies bruising, bleeding, and enlarged lymph nodes.  Physical Exam: BP 122/73  Pulse 118  Temp(Src) 98.5 F (36.9 C) (Oral)  Ht 4\' 9"  (1.448 m)  Wt 154 lb (69.854 kg)  BMI 33.32 kg/m2 General:   Alert and oriented. Pleasant and cooperative. Well-nourished and well-developed.  Head:  Normocephalic and atraumatic. Eyes:  Without icterus, sclera clear and conjunctiva pink.  Ears:  Normal auditory acuity. Nose:  No deformity, discharge,  or lesions. Mouth:  No deformity or lesions, oral mucosa pink.  Neck:  Supple, without mass or thyromegaly. Lungs:  Clear to auscultation bilaterally. No wheezes, rales, or rhonchi. No distress.  Heart:  S1, S2 present without murmurs appreciated.  Abdomen:  +BS, soft, diffusely TTP with only very light touch, out of proportion and non-distended. No HSM noted. No guarding or rebound. No masses appreciated. Examined in chair, patient unable to get on exam table Rectal:  Deferred  Extremities:  Bilateral lower extremity trace edema, braces bilateral legs Neurologic:  Alert and  oriented x4;  grossly normal neurologically. Skin:   Intact without significant lesions or rashes. Psych:  Alert and cooperative. Normal mood and affect.

## 2012-08-01 NOTE — Progress Notes (Signed)
Cc PCP 

## 2012-08-01 NOTE — Assessment & Plan Note (Signed)
42 year old female with POSSIBLE history of colon cancer per her report, status post colon resection at [facility] in the past. I do not have any records whatsoever at time of this appointment. She has an extensive psychiatric and medical history, and I am unsure if everything she has told me is correct. She at first reported loose stools, but then she stated these were normal. Apparently, she has not had surveillance colonoscopy since her original diagnosis. She now reports vague lower abdominal discomfort, similar to her presentation several years ago. Intermittent low-volume hematochezia noted. She needs an updated colonoscopy in the very near future, but I am retrieving operative notes from [facility] first to determine the extent of resection she had performed and to verify her history. She is agreeable to proceeding in the near future, and a discussion of risks and benefits was completed with one of the caretakers present. She is able to sign her own consents.  Proceed with TCS with Dr. Jena Gauss in near future: the risks, benefits, and alternatives have been discussed with the patient in detail. The patient states understanding and desires to proceed. PROPOFOL due to extensive history, polypharmacy

## 2012-08-01 NOTE — Patient Instructions (Addendum)
We will be in contact shortly regarding the date of the colonoscopy.   Further recommendations to follow.

## 2012-08-04 ENCOUNTER — Emergency Department (HOSPITAL_COMMUNITY): Payer: Medicare Other

## 2012-08-04 ENCOUNTER — Encounter (HOSPITAL_COMMUNITY): Payer: Self-pay | Admitting: Emergency Medicine

## 2012-08-04 ENCOUNTER — Emergency Department (HOSPITAL_COMMUNITY)
Admission: EM | Admit: 2012-08-04 | Discharge: 2012-08-05 | Disposition: A | Discharge status: Home / Self Care | Payer: Medicare Other | Attending: Emergency Medicine | Admitting: Emergency Medicine

## 2012-08-04 DIAGNOSIS — R45851 Suicidal ideations: Secondary | ICD-10-CM | POA: Insufficient documentation

## 2012-08-04 DIAGNOSIS — F17200 Nicotine dependence, unspecified, uncomplicated: Secondary | ICD-10-CM | POA: Insufficient documentation

## 2012-08-04 DIAGNOSIS — Z87448 Personal history of other diseases of urinary system: Secondary | ICD-10-CM | POA: Insufficient documentation

## 2012-08-04 DIAGNOSIS — Z8669 Personal history of other diseases of the nervous system and sense organs: Secondary | ICD-10-CM | POA: Insufficient documentation

## 2012-08-04 DIAGNOSIS — G40909 Epilepsy, unspecified, not intractable, without status epilepticus: Secondary | ICD-10-CM | POA: Insufficient documentation

## 2012-08-04 DIAGNOSIS — M25559 Pain in unspecified hip: Secondary | ICD-10-CM | POA: Insufficient documentation

## 2012-08-04 DIAGNOSIS — Z79899 Other long term (current) drug therapy: Secondary | ICD-10-CM | POA: Insufficient documentation

## 2012-08-04 DIAGNOSIS — F209 Schizophrenia, unspecified: Secondary | ICD-10-CM | POA: Insufficient documentation

## 2012-08-04 DIAGNOSIS — F29 Unspecified psychosis not due to a substance or known physiological condition: Secondary | ICD-10-CM | POA: Insufficient documentation

## 2012-08-04 DIAGNOSIS — Z8739 Personal history of other diseases of the musculoskeletal system and connective tissue: Secondary | ICD-10-CM | POA: Insufficient documentation

## 2012-08-04 DIAGNOSIS — F319 Bipolar disorder, unspecified: Secondary | ICD-10-CM | POA: Insufficient documentation

## 2012-08-04 DIAGNOSIS — Z8619 Personal history of other infectious and parasitic diseases: Secondary | ICD-10-CM | POA: Insufficient documentation

## 2012-08-04 DIAGNOSIS — Z87728 Personal history of other specified (corrected) congenital malformations of nervous system and sense organs: Secondary | ICD-10-CM | POA: Insufficient documentation

## 2012-08-04 DIAGNOSIS — G8929 Other chronic pain: Secondary | ICD-10-CM | POA: Insufficient documentation

## 2012-08-04 DIAGNOSIS — Z85038 Personal history of other malignant neoplasm of large intestine: Secondary | ICD-10-CM | POA: Insufficient documentation

## 2012-08-04 DIAGNOSIS — M25569 Pain in unspecified knee: Secondary | ICD-10-CM | POA: Insufficient documentation

## 2012-08-04 LAB — CBC WITH DIFFERENTIAL/PLATELET
Basophils Absolute: 0 10*3/uL (ref 0.0–0.1)
Basophils Relative: 0 % (ref 0–1)
Eosinophils Absolute: 0 10*3/uL (ref 0.0–0.7)
MCH: 27.8 pg (ref 26.0–34.0)
MCHC: 32.3 g/dL (ref 30.0–36.0)
Monocytes Relative: 11 % (ref 3–12)
Neutro Abs: 4.7 10*3/uL (ref 1.7–7.7)
Neutrophils Relative %: 65 % (ref 43–77)
Platelets: 346 10*3/uL (ref 150–400)
RDW: 14.8 % (ref 11.5–15.5)

## 2012-08-04 LAB — TROPONIN I: Troponin I: <0.3 ng/mL (ref <0.30)

## 2012-08-04 LAB — COMPREHENSIVE METABOLIC PANEL
AST: 16 U/L (ref 0–37)
Albumin: 3.4 g/dL — ABNORMAL LOW (ref 3.5–5.2)
Alkaline Phosphatase: 192 U/L — ABNORMAL HIGH (ref 39–117)
BUN: 7 mg/dL (ref 6–23)
Potassium: 3.4 mEq/L — ABNORMAL LOW (ref 3.5–5.1)
Total Protein: 6.9 g/dL (ref 6.0–8.3)

## 2012-08-04 LAB — ETHANOL: Alcohol, Ethyl (B): <11 mg/dL (ref 0–11)
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
## 2012-08-04 NOTE — ED Notes (Signed)
Pt is aax4, NAD noted. Resting in bed, speaking with staff, appears joyful.

## 2012-08-04 NOTE — ED Notes (Signed)
Telepsych monitor in room at this time, waiting on the call
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
## 2012-08-05 NOTE — ED Notes (Signed)
Patient sitting in bed with meal tray, eating. Awaiting ride from group home [facility]).

## 2012-08-05 NOTE — ED Provider Notes (Signed)
Patient evaluated by telepsych.  They feel as though she is not requiring inpatient hospitalization and is stable for discharge.  To follow up with provider, return prn.    Sudie Grumbling, MD 08/05/12 [phone]

## 2012-08-05 NOTE — ED Notes (Addendum)
Pt up for discharge, called [facility], Staff member i spoke with states she will call administrator and see if they can take pt back  in. State they will call me back

## 2012-08-05 NOTE — ED Notes (Signed)
Called pts rest home, state they are sending a ride soon and they should be here at [id number]

## 2012-08-05 NOTE — ED Notes (Signed)
telepsych completed at this time.

## 2012-08-05 NOTE — ED Notes (Signed)
Patient incontinent x 2. Used the bedside commode with assistance of 2 people.Patietn also upset because she might be sent back to the nursing home.

## 2012-08-05 NOTE — ED Notes (Signed)
pts group/rest home state they do not have a ride available until 6am, will watch pt in ED until they arrive for transport. No alternative transport available at this time

## 2012-08-05 NOTE — ED Notes (Signed)
Patient with no complaints at this time. Respirations even and unlabored. Skin warm/dry. Discharge instructions reviewed with patient at this time. Patient given opportunity to voice concerns/ask questions. Patient discharged at this time and left Emergency Department via wheelchair with [facility] care staff.

## 2012-08-05 NOTE — ED Notes (Signed)
Patient is asleep ar this time. Noted equal rise and fall of chest

## 2012-09-02 ENCOUNTER — Encounter: Payer: Self-pay | Admitting: Gastroenterology

## 2012-09-02 ENCOUNTER — Telehealth: Payer: Self-pay | Admitting: Gastroenterology

## 2012-09-02 NOTE — Progress Notes (Signed)
Retrieved op notes from [place]. Appears in Feb 2011 underwent left hemicolectomy by Dr. Rahbar due to a large unresectable splenic flexure polyp. I DO NOT HAVE PATH REPORTS.   Due to low-volume hematochezia and questionable personal history of colon cancer (this is per patient; it is unclear if she truly was diagnosed with colon cancer) I feel we need to see her back ASAP to set up colonoscopy with Dr. Rourk. In the meantime, we need to obtain the path reports from the left colectomy done in 2011 ASAP. It looks like we requested this already, but they did not send. Please have them fax to us as soon as possible.  

## 2012-09-02 NOTE — Telephone Encounter (Signed)
Retrieved op notes from [facility]. Appears in Feb 2011 underwent left hemicolectomy by Dr. Peri Jefferson due to a large unresectable splenic flexure polyp. I DO NOT HAVE PATH REPORTS.   Due to low-volume hematochezia and questionable personal history of colon cancer (this is per patient; it is unclear if she truly was diagnosed with colon cancer) I feel we need to see her back ASAP to set up colonoscopy with Dr. Jena Gauss. In the meantime, we need to obtain the path reports from the left colectomy done in 2011 ASAP. It looks like we requested this already, but they did not send. Please have them fax to [place] as soon as possible.

## 2012-09-02 NOTE — Telephone Encounter (Signed)
Does appointment need to be with [facility]?

## 2012-09-03 NOTE — Telephone Encounter (Signed)
Patient is scheduled with [facility] on May 21st an caregiver is aware

## 2012-09-03 NOTE — Telephone Encounter (Signed)
No, appt with me. Sorry!

## 2012-09-03 NOTE — Telephone Encounter (Signed)
Please advise 

## 2012-09-11 ENCOUNTER — Encounter: Payer: Self-pay | Admitting: Gastroenterology

## 2012-09-11 ENCOUNTER — Ambulatory Visit (INDEPENDENT_AMBULATORY_CARE_PROVIDER_SITE_OTHER): Payer: Medicare Other | Admitting: Gastroenterology

## 2012-09-11 VITALS — BP 120/73 | HR 103 | Temp 98.0°F | Ht <= 58 in | Wt 150.0 lb

## 2012-09-11 DIAGNOSIS — Z85038 Personal history of other malignant neoplasm of large intestine: Secondary | ICD-10-CM

## 2012-09-11 DIAGNOSIS — D649 Anemia, unspecified: Secondary | ICD-10-CM

## 2012-09-11 MED ORDER — PEG-KCL-NACL-NASULF-NA ASC-C 100 G PO SOLR: 1.0000 | ORAL | Status: DC

## 2012-09-11 MED ORDER — LINACLOTIDE 145 MCG PO CAPS: 145.0000 ug | ORAL_CAPSULE | Freq: Every day | ORAL | Status: DC

## 2012-09-11 NOTE — Patient Instructions (Addendum)
Please complete blood work today. Complete stool sample and return to our office.  We have scheduled you for a colonoscopy with a possible upper endoscopy in the near future.  Start taking Linzess each morning, 30 minutes before breakfast. This is for constipation. I have sent this to your pharmacy and provided samples.

## 2012-09-11 NOTE — Progress Notes (Signed)
Referring Provider: Isabella Stalling, MD Primary Care Physician:  Isabella Stalling, MD Primary GI: Dr. Jena Gauss    Chief Complaint  Patient presents with  . Rectal Pain  . Constipation  . Colonoscopy    HPI:   Lori [name] is a 42 year old female with a somewhat vague, unclear past medical history that involves the possibility of colon cancer per her report. She stays at a rest home and has a significant psychiatric history. I saw her recently due to a referral from Dr. Janna Arch secondary to abdominal pain. At that time, her history was scattered and her subjective reports were not consistent. Appears in Feb 2011 she underwent left hemicolectomy by Dr. Peri Jefferson at [facility] due to a large unresectable splenic flexure polyp. I DO NOT HAVE PATH REPORTS. We have requested these repeatedly to no avail.  Last colonoscopy unknown but likely sometime in late 2010 or 2011, as the colectomy was performed in Feb 2011. Sees bright red blood in stool intermittently. Tried to run away from rest home last night per her report. Dragged across her bottom on the rocks. States she has a sore on her bottom. +constipation.  States #3 on Bristol scale. Round and hard. Clumps of stools sometimes. Miralax tastes bitter to her. Hasn't had any in a year per patient. For past few months, notes abdominal discomfort periumbically, AT THE INCISION. Worse with turning/twisting. States when she eats, it irritates it. Protonix. Takes Ibuprofen. Thinks she saw black/tarry stool. Every other week takes ibuprofen. She has noted intermittent hematochezia. She is quite concerned that she has colon cancer.    Past Medical History  Diagnosis Date  . Spinal bifida, closed   . UTI (lower urinary tract infection)   . Seizures   . Cancer     colon  . Genital herpes   . Arthritis   . Hydrocephalus   . Psychoses   . Bipolar disease, chronic     per patient  . Schizophrenia     states she was told she had 939 different  personalities    Past Surgical History  Procedure Laterality Date  . Club foot release    . Spinal fusion    . Hip surgery    . Dental surgery    . Colon resection    . Shunt revision  1975  . Left colectomy      due to large endoscopically unresectable splenic flexure polyp    Current Outpatient Prescriptions  Medication Sig Dispense Refill  . calamine lotion Apply 1 application topically 3 (three) times daily as needed. :For Itching  120 mL    . chlorproMAZINE (THORAZINE) 100 MG tablet Take 100 mg by mouth 3 (three) times daily.      . citalopram (CELEXA) 40 MG tablet Take 1 tablet (40 mg total) by mouth at bedtime. For depression  30 tablet  0  . gabapentin (NEURONTIN) 100 MG capsule Take 1 capsule (100 mg total) by mouth 2 (two) times daily. For anxiety  60 capsule  0  . loratadine (CLARITIN) 10 MG tablet Take 10 mg by mouth daily.      Marland Kitchen lurasidone (LATUDA) 80 MG TABS Take 1 tablet (80 mg total) by mouth at bedtime. For mood control  30 tablet  0  . pantoprazole (PROTONIX) 20 MG tablet Take 1 tablet (20 mg total) by mouth daily. For acid reflux      . PHENobarbital (LUMINAL) 64.8 MG tablet Take 1 tablet (64.8 mg total) by mouth at bedtime.  For simple partial seizures.      . polyethylene glycol (MIRALAX / GLYCOLAX) packet Take 17 g by mouth daily as needed. For constipation  14 each    . pravastatin (PRAVACHOL) 40 MG tablet Take 1 tablet (40 mg total) by mouth every morning. For cholesterol control  30 tablet    . selenium sulfide (SELSUN) 1 % LOTN Apply 1 application topically daily as needed. :For dry itchy scalp      . traZODone (DESYREL) 50 MG tablet Take 1 tablet (50 mg total) by mouth at bedtime as needed and may repeat dose one time if needed for sleep. For sleep/depression  30 tablet  0   No current facility-administered medications for this visit.    Allergies as of 09/11/2012 - Review Complete 09/11/2012  Allergen Reaction Noted  . Advil (ibuprofen) Rash 12/21/2011   . Bee venom Anaphylaxis 12/21/2011  . Crab (shellfish allergy) Anaphylaxis and Rash 12/21/2011  . Morphine and related Anaphylaxis 12/21/2011  . Aleve (naproxen sodium) Rash 12/21/2011  . Asa (aspirin) Rash 12/21/2011    Family History  Problem Relation Age of Onset  . Colon cancer Mother     History   Social History  . Marital Status: Single    Spouse Name: N/A    Number of Children: N/A  . Years of Education: N/A   Social History Main Topics  . Smoking status: Current Every Day Smoker -- 2.00 packs/day for 1 years  . Smokeless tobacco: None  . Alcohol Use: No     Comment: Rarely drinks and limits to no more than 4 drinks per episode  . Drug Use: No  . Sexually Active: No   Other Topics Concern  . None   Social History Narrative  . None    Review of Systems: Negative unless mentioned in HPI  Physical Exam: BP 120/73  Pulse 103  Temp(Src) 98 F (36.7 C) (Oral)  Ht 4\' 9"  (1.448 m)  Wt 150 lb (68.04 kg)  BMI 32.45 kg/m2 General:   Alert and oriented. No distress noted. Pleasant and cooperative.  Head:  Normocephalic and atraumatic. Eyes:  Conjuctiva clear without scleral icterus. Mouth:  Oral mucosa pink and moist. Good dentition. No lesions. Neck:  Supple, without mass or thyromegaly. Heart:  S1, S2 present without murmurs, rubs, or gallops. Regular rate and rhythm. Abdomen:  +BS, soft, non-tender and non-distended. No rebound or guarding. No HSM or masses noted. Rectal: left buttock with large well-demarcated "rectangular" abrasion, with foam pads as covering. Appears superificial Msk:  Braces to bilateral lower extremities  Extremities:  Without edema. Neurologic:  Alert and  oriented x4;  grossly normal neurologically. Skin:  Intact without significant lesions or rashes. Cervical Nodes:  No significant cervical adenopathy. Psych:  Alert and cooperative. Normal mood and affect.  Lab Results  Component Value Date   WBC 7.2 08/04/2012   HGB 11.0*  08/04/2012   HCT 34.1* 08/04/2012   MCV 86.1 08/04/2012   PLT 346 08/04/2012

## 2012-09-12 ENCOUNTER — Encounter (HOSPITAL_COMMUNITY): Payer: Self-pay | Admitting: Pharmacy Technician

## 2012-09-12 DIAGNOSIS — D649 Anemia, unspecified: Secondary | ICD-10-CM | POA: Insufficient documentation

## 2012-09-12 NOTE — Assessment & Plan Note (Signed)
42 year old female who self-reports a history of colon cancer, ALTHOUGH WE HAVE NOT BEEN ABLE TO VERIFY THIS. Op notes reviewed from [facility] 2011, noting left hemicolectomy by Dr. Peri Jefferson due to a large unresectable splenic flexure polyp. I have been unable to retrieve op notes to date. With her reports of hematochezia, abdominal pain, and possible history of colon cancer, I feel we need to pursue lower GI evaluation. In the interim, add Linzess daily.   Proceed with TCS with Dr. Jena Gauss in near future: the risks, benefits, and alternatives have been discussed with the patient in detail. The patient states understanding and desires to proceed. PROPOFOL DUE TO POLYPHARMACY.

## 2012-09-12 NOTE — Assessment & Plan Note (Signed)
Mild normocytic anemia noted, with questionable melena. Obtain ifobt, iron, and ferritin. Add +/- EGD at time of colonoscopy.

## 2012-09-13 ENCOUNTER — Ambulatory Visit (INDEPENDENT_AMBULATORY_CARE_PROVIDER_SITE_OTHER): Payer: Medicare Other | Admitting: Gastroenterology

## 2012-09-13 DIAGNOSIS — D649 Anemia, unspecified: Secondary | ICD-10-CM

## 2012-09-13 LAB — IFOBT (OCCULT BLOOD): IFOBT: POSITIVE

## 2012-09-13 NOTE — Progress Notes (Signed)
Cc PCP 

## 2012-09-13 NOTE — Addendum Note (Signed)
Addended by: Jennings Books on: 09/13/2012 10:02 AM   Modules accepted: Orders

## 2012-09-17 ENCOUNTER — Telehealth: Payer: Self-pay

## 2012-09-17 MED ORDER — LUBIPROSTONE 8 MCG PO CAPS: 8.0000 ug | ORAL_CAPSULE | Freq: Two times a day (BID) | ORAL | Status: DC

## 2012-09-17 NOTE — Telephone Encounter (Signed)
I sent in Amitiza po BID, one month supply.

## 2012-09-17 NOTE — Telephone Encounter (Signed)
Received PA response from insurance company. Insurance will not pay for Linzess until pt has tried Sales executive. Pt will need new rx.

## 2012-09-18 ENCOUNTER — Encounter (HOSPITAL_COMMUNITY): Admission: RE | Payer: Self-pay | Source: Ambulatory Visit

## 2012-09-18 ENCOUNTER — Ambulatory Visit (HOSPITAL_COMMUNITY): Admission: RE | Admit: 2012-09-18 | Payer: Medicare Other | Source: Ambulatory Visit | Admitting: Internal Medicine

## 2012-09-18 SURGERY — COLONOSCOPY
Anesthesia: Moderate Sedation

## 2012-09-20 ENCOUNTER — Encounter (HOSPITAL_COMMUNITY): Payer: Self-pay | Admitting: *Deleted

## 2012-09-20 ENCOUNTER — Telehealth (HOSPITAL_COMMUNITY): Payer: Self-pay | Admitting: Pharmacist

## 2012-09-20 ENCOUNTER — Ambulatory Visit (HOSPITAL_COMMUNITY)
Admission: RE | Admit: 2012-09-20 | Discharge: 2012-09-20 | Disposition: A | Discharge status: Home / Self Care | Payer: Medicare Other | Source: Ambulatory Visit | Attending: Family Medicine | Admitting: Family Medicine

## 2012-09-20 ENCOUNTER — Emergency Department (HOSPITAL_COMMUNITY)
Admission: EM | Admit: 2012-09-20 | Discharge: 2012-09-20 | Disposition: A | Discharge status: Other Acute Inpatient Hospital | Payer: Medicare Other | Attending: Emergency Medicine | Admitting: Emergency Medicine

## 2012-09-20 ENCOUNTER — Other Ambulatory Visit (HOSPITAL_COMMUNITY): Payer: Self-pay | Admitting: Family Medicine

## 2012-09-20 DIAGNOSIS — G40909 Epilepsy, unspecified, not intractable, without status epilepticus: Secondary | ICD-10-CM | POA: Insufficient documentation

## 2012-09-20 DIAGNOSIS — Z87440 Personal history of urinary (tract) infections: Secondary | ICD-10-CM | POA: Insufficient documentation

## 2012-09-20 DIAGNOSIS — Y9389 Activity, other specified: Secondary | ICD-10-CM | POA: Insufficient documentation

## 2012-09-20 DIAGNOSIS — F17200 Nicotine dependence, unspecified, uncomplicated: Secondary | ICD-10-CM | POA: Insufficient documentation

## 2012-09-20 DIAGNOSIS — Z87728 Personal history of other specified (corrected) congenital malformations of nervous system and sense organs: Secondary | ICD-10-CM | POA: Insufficient documentation

## 2012-09-20 DIAGNOSIS — Z8619 Personal history of other infectious and parasitic diseases: Secondary | ICD-10-CM | POA: Insufficient documentation

## 2012-09-20 DIAGNOSIS — Z85038 Personal history of other malignant neoplasm of large intestine: Secondary | ICD-10-CM | POA: Insufficient documentation

## 2012-09-20 DIAGNOSIS — Z79899 Other long term (current) drug therapy: Secondary | ICD-10-CM | POA: Insufficient documentation

## 2012-09-20 DIAGNOSIS — R52 Pain, unspecified: Secondary | ICD-10-CM

## 2012-09-20 DIAGNOSIS — Z9889 Other specified postprocedural states: Secondary | ICD-10-CM | POA: Insufficient documentation

## 2012-09-20 DIAGNOSIS — Y92009 Unspecified place in unspecified non-institutional (private) residence as the place of occurrence of the external cause: Secondary | ICD-10-CM | POA: Insufficient documentation

## 2012-09-20 DIAGNOSIS — Z8659 Personal history of other mental and behavioral disorders: Secondary | ICD-10-CM | POA: Insufficient documentation

## 2012-09-20 DIAGNOSIS — W19XXXA Unspecified fall, initial encounter: Secondary | ICD-10-CM

## 2012-09-20 DIAGNOSIS — S7291XA Unspecified fracture of right femur, initial encounter for closed fracture: Secondary | ICD-10-CM

## 2012-09-20 DIAGNOSIS — F319 Bipolar disorder, unspecified: Secondary | ICD-10-CM | POA: Insufficient documentation

## 2012-09-20 DIAGNOSIS — S7290XA Unspecified fracture of unspecified femur, initial encounter for closed fracture: Secondary | ICD-10-CM | POA: Insufficient documentation

## 2012-09-20 DIAGNOSIS — M129 Arthropathy, unspecified: Secondary | ICD-10-CM | POA: Insufficient documentation

## 2012-09-20 DIAGNOSIS — F209 Schizophrenia, unspecified: Secondary | ICD-10-CM | POA: Insufficient documentation

## 2012-09-20 DIAGNOSIS — R296 Repeated falls: Secondary | ICD-10-CM | POA: Insufficient documentation

## 2012-09-20 DIAGNOSIS — Q059 Spina bifida, unspecified: Secondary | ICD-10-CM | POA: Insufficient documentation

## 2012-09-20 LAB — CBC WITH DIFFERENTIAL/PLATELET
Basophils Relative: 0 % (ref 0–1)
Eosinophils Relative: 0 % (ref 0–5)
HCT: 36.3 % (ref 36.0–46.0)
Hemoglobin: 11.3 g/dL — ABNORMAL LOW (ref 12.0–15.0)
MCHC: 31.1 g/dL (ref 30.0–36.0)
MCV: 88.1 fL (ref 78.0–100.0)
Monocytes Absolute: 0.6 10*3/uL (ref 0.1–1.0)
Monocytes Relative: 7 % (ref 3–12)
Neutro Abs: 7 10*3/uL (ref 1.7–7.7)

## 2012-09-20 LAB — BASIC METABOLIC PANEL
BUN: 9 mg/dL (ref 6–23)
CO2: 25 mEq/L (ref 19–32)
Calcium: 9.1 mg/dL (ref 8.4–10.5)
Chloride: 99 mEq/L (ref 96–112)
Creatinine, Ser: 0.35 mg/dL — ABNORMAL LOW (ref 0.50–1.10)
GFR calc Af Amer: >90 mL/min (ref >90)

## 2012-09-20 LAB — FERRITIN: Ferritin: 35 ng/mL (ref 10–291)

## 2012-09-20 MED ORDER — OXYCODONE-ACETAMINOPHEN 5-325 MG PO TABS
ORAL_TABLET | ORAL | Status: AC
  Administered 2012-09-20: 1 via ORAL
  Filled 2012-09-20: qty 1

## 2012-09-20 MED ORDER — OXYCODONE-ACETAMINOPHEN 5-325 MG PO TABS: 1.0000 | ORAL_TABLET | Freq: Once | ORAL | Status: AC

## 2012-09-20 MED ORDER — HYDROMORPHONE HCL PF 1 MG/ML IJ SOLN
1.0000 mg | Freq: Once | INTRAMUSCULAR | Status: AC
  Administered 2012-09-20: 1 mg via INTRAVENOUS
  Filled 2012-09-20: qty 1

## 2012-09-20 MED ORDER — ONDANSETRON HCL 4 MG/2ML IJ SOLN
4.0000 mg | Freq: Once | INTRAMUSCULAR | Status: AC
  Administered 2012-09-20: 4 mg via INTRAVENOUS
  Filled 2012-09-20: qty 2

## 2012-09-20 MED ORDER — SODIUM CHLORIDE 0.9 % IV BOLUS (SEPSIS)
500.0000 mL | Freq: Once | INTRAVENOUS | Status: AC
  Administered 2012-09-20: 500 mL via INTRAVENOUS

## 2012-09-20 NOTE — ED Notes (Signed)
[facility] called for transport to  [facility] after approval from nurse.

## 2012-09-20 NOTE — ED Notes (Signed)
Patient's guardian, [name] was notified of patient's transfer.  She consented to transfer.

## 2012-09-20 NOTE — ED Notes (Signed)
Report called to Wilford Sports, RN at [facility] ER.

## 2012-09-20 NOTE — ED Provider Notes (Signed)
History     CSN: [id number]  Arrival date & time 09/20/12  1146   First MD Initiated Contact with Patient 09/20/12 1251      Chief Complaint  Patient presents with  . Hip Injury    (Consider location/radiation/quality/duration/timing/severity/associated sxs/prior treatment) HPI..... accidental mechanical fall approximately 2 days ago at rest home injuring right femur.   Patient has spina bifida and walks with braces on both legs from her knees distally.  No other injuries. Palpation makes pain worse. Severity is moderate to severe. No head or neck trauma  Past Medical History  Diagnosis Date  . Spinal bifida, closed   . UTI (lower urinary tract infection)   . Seizures   . Cancer     colon  . Genital herpes   . Arthritis   . Hydrocephalus   . Psychoses   . Bipolar disease, chronic     per patient  . 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 Club foot release    . Spinal fusion    . Hip surgery    . Dental surgery    . Colon resection    . Shunt revision  1975  . Left colectomy      due to large endoscopically unresectable splenic flexure polyp    Family History  Problem Relation Age of Onset  . Colon cancer Mother     History  Substance Use Topics  . Smoking status: Current Every Day Smoker -- 2.00 packs/day for 1 years  . Smokeless tobacco: Not on file  . Alcohol Use: No     Comment: Rarely drinks and limits to no more than 4 drinks per episode    OB History   Grav Para Term Preterm Abortions TAB SAB Ect Mult Living                  Review of Systems  All other systems reviewed and are negative.    Allergies  Advil; Bee venom; Crab; Morphine and related; Aleve; and Asa  Home Medications   Current Outpatient Rx  Name  Route  Sig  Dispense  Refill  . chlorproMAZINE (THORAZINE) 100 MG tablet   Oral   Take 100 mg by mouth 3 (three) times daily.          . citalopram (CELEXA) 40 MG tablet   Oral   Take 1 tablet (40 mg total) by mouth at bedtime. For depression   30 tablet   0   . gabapentin (NEURONTIN) 100 MG capsule   Oral   Take 1 capsule (100 mg total) by mouth 2 (two) times daily. For anxiety   60 capsule   0   . loratadine (CLARITIN) 10 MG tablet   Oral   Take 10 mg by mouth daily.         Marland Kitchen lurasidone (LATUDA) 80 MG TABS   Oral   Take 80 mg by mouth at bedtime.         . pantoprazole (PROTONIX) 20 MG tablet   Oral   Take 1 tablet (20 mg total) by mouth daily. For acid reflux         . PHENobarbital (LUMINAL) 64.8 MG tablet   Oral   Take 64.8 mg by mouth at bedtime.         . simvastatin (ZOCOR) 40 MG tablet   Oral   Take 40  mg by mouth every evening.         . traZODone (DESYREL) 50 MG tablet   Oral   Take 50 mg by mouth at bedtime.           BP 139/86  Pulse 95  Temp(Src) 98.1 F (36.7 C) (Oral)  Resp 18  Ht 4\' 11"  (1.499 m)  Wt 151 lb (68.493 kg)  BMI 30.48 kg/m2  SpO2 99%  Physical Exam  Nursing note and vitals reviewed. Constitutional: She is oriented to person, place, and time. She appears well-developed and well-nourished.  HENT:  Head: Normocephalic and atraumatic.  Eyes: Conjunctivae and EOM are normal. Pupils are equal, round, and reactive to light.  Neck: Normal range of motion. Neck supple.  Cardiovascular: Normal rate, regular rhythm and normal heart sounds.   Pulmonary/Chest: Effort normal and breath sounds normal.  Abdominal: Soft. Bowel sounds are normal.  Musculoskeletal:  Tender proximal right lateral femur.    Leg braces bilaterally from knee distally  Neurological: She is alert and oriented to person, place, and time.  Skin: Skin is warm and dry.  Psychiatric: She has a normal mood and affect.    ED Course  Procedures (including critical care time)  Labs Reviewed  CBC WITH DIFFERENTIAL - Abnormal; Notable for the following:    Hemoglobin 11.3 (*)     Platelets 422 (*)    Neutrophils Relative % 79 (*)    All other components within normal limits  BASIC METABOLIC PANEL - Abnormal; Notable for the following:    Glucose, Bld 104 (*)    Creatinine, Ser 0.35 (*)    All other components within normal limits   Dg Femur Right  09/20/2012   *RADIOLOGY REPORT*  Clinical Data: Status post fall 4 days ago.  Right hip pain.  RIGHT FEMUR - 2 VIEW  Comparison: None.  Findings: The patient has a subtrochanteric right femur fracture. The fracture is oblique in orientation extending from the medial cortex 6 cm below the top of the greater trochanter through the lateral cortex 11 cm below the greater trochanter.  The fracture shows mild medial displacement.  Bones appear osteopenic.  No other acute bony or joint abnormality is identified.  IMPRESSION: Subtrochanteric right femur fracture as described.  These results will be called to the ordering clinician or representative by the Radiologist Assistant, and communication documented in the PACS Dashboard.   Original Report Authenticated By: Holley Dexter, M.D.     1. Femur fracture, right, closed, initial encounter   2. Spina bifida       MDM  Plain films of right femur show a subtrochanteric fracture oblique slightly displaced.  Stress with orthopedic surgeon Dr. Donnalee Curry will accept patient in transfer to [facility].        Donnetta Hutching, MD 09/20/12 [phone]

## 2012-09-20 NOTE — ED Notes (Signed)
No [facility] ER coverage today.  Attempted Ortho at [facility].   Now waiting return call from [facility], PALS Line for Dr. Adriana Simas.

## 2012-09-20 NOTE — ED Notes (Signed)
Fell at rest home 2-3 days ago.  Saw PMD today w/x-ray showing R hip fracture.

## 2012-09-23 NOTE — Progress Notes (Signed)
Quick Note:  Heme positive, with low iron and low normal ferritin. +/- EGD at time of colonoscopy. ______

## 2012-09-24 NOTE — Progress Notes (Signed)
Quick Note:  Spoke with employee of [facility]. Pt has broken her femur and is in [place] right now. She is scheduled for surgery and they are unaware how long she will be there because she will need PT afterward. They are requesting that we cancel her procedure and they will call when she returns for an appointment to schedule procedures.   Leighann, please cancel procedure. ______

## 2012-09-24 NOTE — Progress Notes (Signed)
DONE

## 2012-09-25 NOTE — Progress Notes (Signed)
Ok

## 2012-09-30 ENCOUNTER — Inpatient Hospital Stay (HOSPITAL_COMMUNITY): Admission: RE | Admit: 2012-09-30 | Payer: Self-pay | Source: Ambulatory Visit

## 2012-10-03 ENCOUNTER — Ambulatory Visit (HOSPITAL_COMMUNITY): Admission: RE | Admit: 2012-10-03 | Payer: Medicare Other | Source: Ambulatory Visit | Admitting: Internal Medicine

## 2012-10-03 ENCOUNTER — Encounter (HOSPITAL_COMMUNITY): Admission: RE | Payer: Self-pay | Source: Ambulatory Visit

## 2012-10-03 SURGERY — COLONOSCOPY WITH PROPOFOL
Anesthesia: Monitor Anesthesia Care

## 2013-07-17 NOTE — Telephone Encounter (Signed)
Late entry: completed medication history with patient via phone prior to procedure.  Netta Cedars, PharmD, BCPS
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
## 2013-09-21 IMAGING — CR DG CHEST 1V
1 series · 1 of 1 positions shown · non-contrast
Comparison: CT chest 04/21/2012.  PA and lateral chest 04/21/2012
and single view of the chest 07/14/2012.

CLINICAL DATA: Pain. Ventriculostomy shunt catheter.

CHEST - 1 VIEW

[view not recorded]
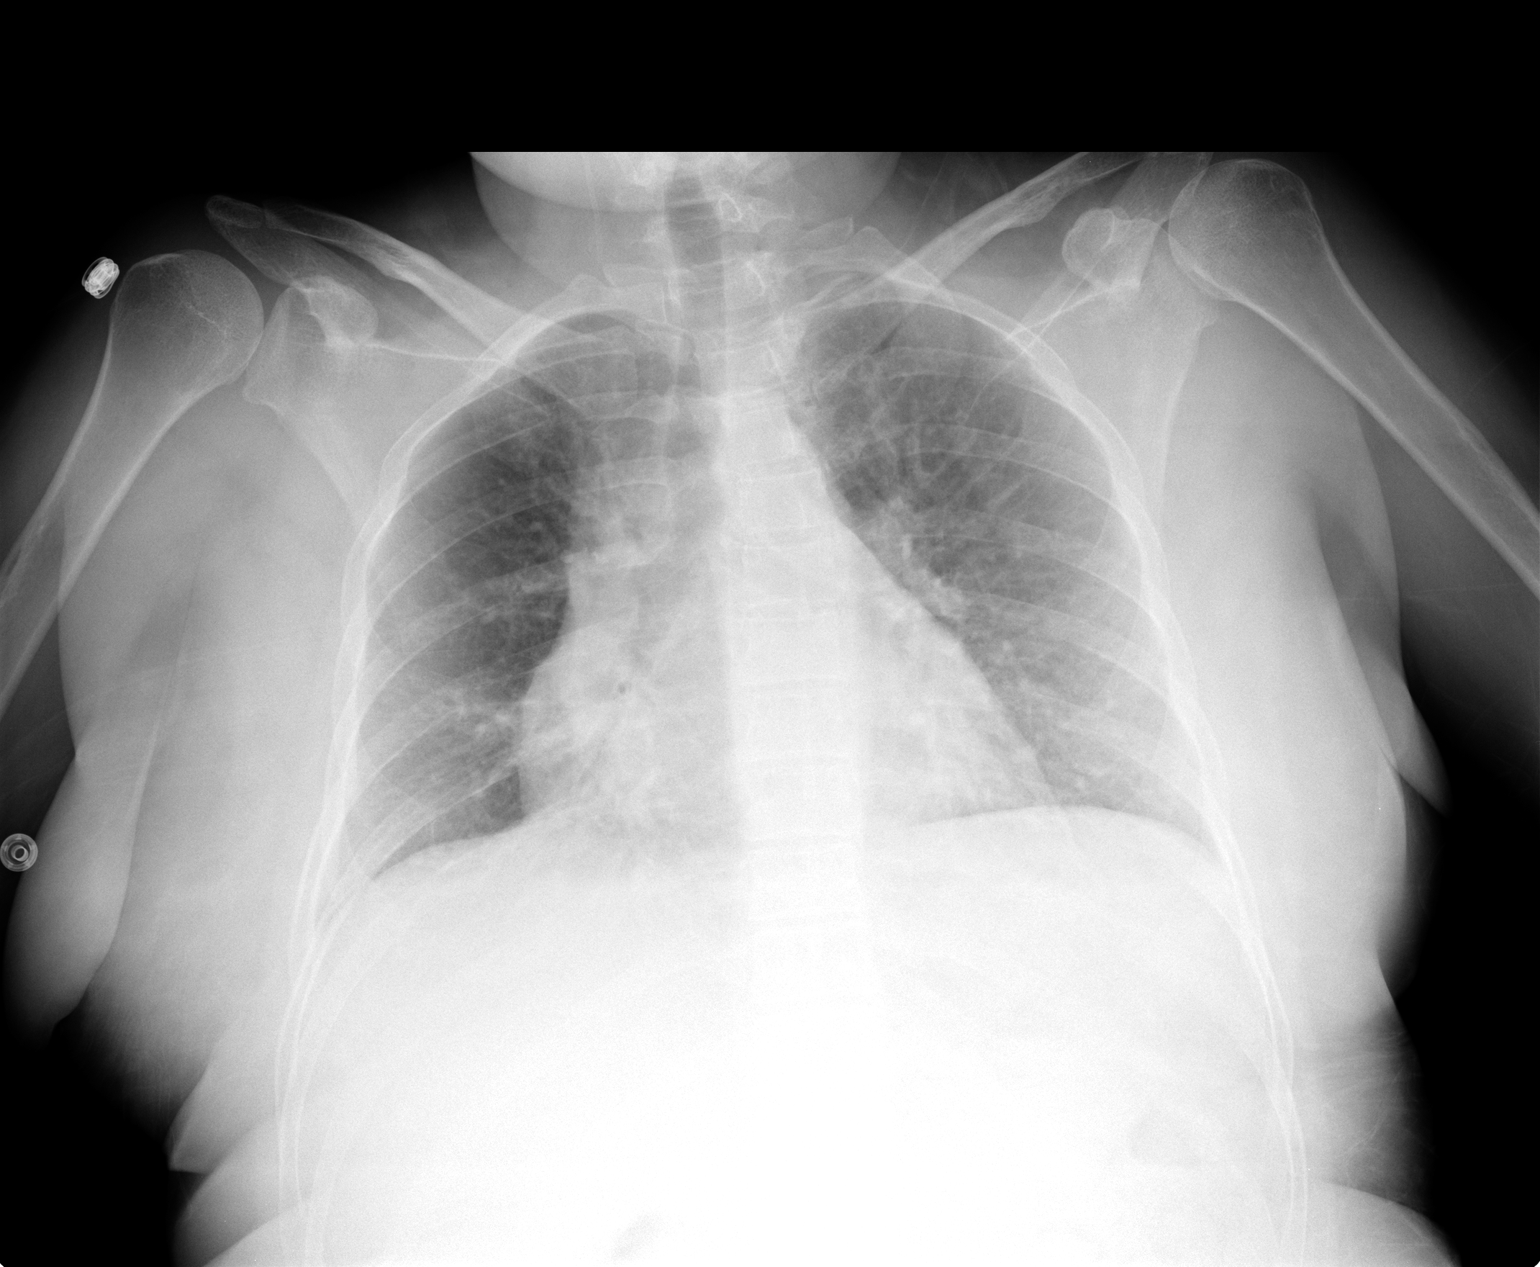

[1 of 1 positions shown; findings below may reference images not displayed]

FINDINGS: As seen on the prior studies, the patient's
ventriculostomy shunt catheter terminates in the right neck.  The
patient is rotated on the study.  There is cardiomegaly.  No
pneumothorax or pleural effusion.  No focal bony abnormality.
IMPRESSION: No acute disease. Right ventriculostomy shunt catheter is unchanged
terminating in the right neck.
